# Patient Record
Sex: Female | Born: 1955 | Race: White | Hispanic: No | Marital: Married | Smoking: Never smoker
Health system: Southern US, Community
[De-identification: ages and names within clinical notes are randomized; demographics above are authoritative.]

## PROBLEM LIST (undated history)

## (undated) DIAGNOSIS — D649 Anemia, unspecified: Secondary | ICD-10-CM

## (undated) DIAGNOSIS — E785 Hyperlipidemia, unspecified: Secondary | ICD-10-CM

## (undated) DIAGNOSIS — M259 Joint disorder, unspecified: Secondary | ICD-10-CM

## (undated) DIAGNOSIS — E079 Disorder of thyroid, unspecified: Secondary | ICD-10-CM

## (undated) DIAGNOSIS — M1990 Unspecified osteoarthritis, unspecified site: Secondary | ICD-10-CM

## (undated) DIAGNOSIS — Z87898 Personal history of other specified conditions: Secondary | ICD-10-CM

## (undated) DIAGNOSIS — N840 Polyp of corpus uteri: Secondary | ICD-10-CM

## (undated) DIAGNOSIS — Z8619 Personal history of other infectious and parasitic diseases: Secondary | ICD-10-CM

## (undated) DIAGNOSIS — N979 Female infertility, unspecified: Secondary | ICD-10-CM

## (undated) DIAGNOSIS — B379 Candidiasis, unspecified: Secondary | ICD-10-CM

## (undated) DIAGNOSIS — T7840XA Allergy, unspecified, initial encounter: Secondary | ICD-10-CM

## (undated) DIAGNOSIS — B977 Papillomavirus as the cause of diseases classified elsewhere: Secondary | ICD-10-CM

## (undated) DIAGNOSIS — B029 Zoster without complications: Secondary | ICD-10-CM

## (undated) DIAGNOSIS — I10 Essential (primary) hypertension: Secondary | ICD-10-CM

## (undated) DIAGNOSIS — R011 Cardiac murmur, unspecified: Secondary | ICD-10-CM

## (undated) HISTORY — DX: Zoster without complications: B02.9

## (undated) HISTORY — DX: Candidiasis, unspecified: B37.9

## (undated) HISTORY — PX: LAPAROSCOPY: SHX197

## (undated) HISTORY — DX: Female infertility, unspecified: N97.9

## (undated) HISTORY — DX: Polyp of corpus uteri: N84.0

## (undated) HISTORY — DX: Anemia, unspecified: D64.9

## (undated) HISTORY — DX: Personal history of other infectious and parasitic diseases: Z86.19

## (undated) HISTORY — PX: BUNIONECTOMY: SHX129

## (undated) HISTORY — PX: DILATION AND CURETTAGE OF UTERUS: SHX78

## (undated) HISTORY — DX: Essential (primary) hypertension: I10

## (undated) HISTORY — PX: WISDOM TOOTH EXTRACTION: SHX21

## (undated) HISTORY — PX: OTHER SURGICAL HISTORY: SHX169

## (undated) HISTORY — DX: Papillomavirus as the cause of diseases classified elsewhere: B97.7

## (undated) HISTORY — DX: Joint disorder, unspecified: M25.9

## (undated) HISTORY — DX: Disorder of thyroid, unspecified: E07.9

## (undated) HISTORY — DX: Unspecified osteoarthritis, unspecified site: M19.90

## (undated) HISTORY — DX: Hyperlipidemia, unspecified: E78.5

## (undated) HISTORY — DX: Allergy, unspecified, initial encounter: T78.40XA

## (undated) HISTORY — DX: Cardiac murmur, unspecified: R01.1

## (undated) HISTORY — DX: Personal history of other specified conditions: Z87.898

## (undated) HISTORY — PX: KNEE SURGERY: SHX244

---

## 1997-07-18 ENCOUNTER — Encounter (HOSPITAL_COMMUNITY): Admission: RE | Admit: 1997-07-18 | Discharge: 1997-10-16 | Payer: Self-pay | Admitting: Obstetrics and Gynecology

## 1997-11-14 ENCOUNTER — Encounter (HOSPITAL_COMMUNITY): Admission: RE | Admit: 1997-11-14 | Discharge: 1998-02-12 | Payer: Self-pay | Admitting: Obstetrics and Gynecology

## 2000-11-10 ENCOUNTER — Other Ambulatory Visit: Admission: RE | Admit: 2000-11-10 | Discharge: 2000-11-10 | Payer: Self-pay | Admitting: Obstetrics and Gynecology

## 2000-11-19 ENCOUNTER — Encounter: Payer: Self-pay | Admitting: Obstetrics and Gynecology

## 2000-11-19 ENCOUNTER — Ambulatory Visit (HOSPITAL_COMMUNITY): Admission: RE | Admit: 2000-11-19 | Discharge: 2000-11-19 | Payer: Self-pay | Admitting: Obstetrics and Gynecology

## 2000-11-26 ENCOUNTER — Encounter: Admission: RE | Admit: 2000-11-26 | Discharge: 2000-11-26 | Payer: Self-pay | Admitting: Obstetrics and Gynecology

## 2000-11-26 ENCOUNTER — Encounter: Payer: Self-pay | Admitting: Obstetrics and Gynecology

## 2001-11-30 ENCOUNTER — Encounter: Payer: Self-pay | Admitting: Obstetrics and Gynecology

## 2001-11-30 ENCOUNTER — Encounter: Admission: RE | Admit: 2001-11-30 | Discharge: 2001-11-30 | Payer: Self-pay | Admitting: Obstetrics and Gynecology

## 2001-12-01 ENCOUNTER — Other Ambulatory Visit: Admission: RE | Admit: 2001-12-01 | Discharge: 2001-12-01 | Payer: Self-pay | Admitting: Obstetrics and Gynecology

## 2002-12-07 ENCOUNTER — Other Ambulatory Visit: Admission: RE | Admit: 2002-12-07 | Discharge: 2002-12-07 | Payer: Self-pay | Admitting: *Deleted

## 2002-12-26 ENCOUNTER — Encounter: Admission: RE | Admit: 2002-12-26 | Discharge: 2002-12-26 | Payer: Self-pay | Admitting: Obstetrics and Gynecology

## 2002-12-26 ENCOUNTER — Encounter: Payer: Self-pay | Admitting: Obstetrics and Gynecology

## 2004-01-23 ENCOUNTER — Encounter: Admission: RE | Admit: 2004-01-23 | Discharge: 2004-01-23 | Payer: Self-pay | Admitting: Obstetrics and Gynecology

## 2004-02-29 ENCOUNTER — Other Ambulatory Visit: Admission: RE | Admit: 2004-02-29 | Discharge: 2004-02-29 | Payer: Self-pay | Admitting: Obstetrics and Gynecology

## 2005-01-30 ENCOUNTER — Ambulatory Visit (HOSPITAL_COMMUNITY): Admission: RE | Admit: 2005-01-30 | Discharge: 2005-01-30 | Payer: Self-pay | Admitting: Obstetrics and Gynecology

## 2005-02-12 ENCOUNTER — Encounter: Admission: RE | Admit: 2005-02-12 | Discharge: 2005-02-12 | Payer: Self-pay | Admitting: Obstetrics and Gynecology

## 2005-03-31 ENCOUNTER — Encounter (INDEPENDENT_AMBULATORY_CARE_PROVIDER_SITE_OTHER): Payer: Self-pay | Admitting: *Deleted

## 2005-05-05 ENCOUNTER — Other Ambulatory Visit: Admission: RE | Admit: 2005-05-05 | Discharge: 2005-05-05 | Payer: Self-pay | Admitting: Obstetrics and Gynecology

## 2005-08-29 ENCOUNTER — Ambulatory Visit: Payer: Self-pay | Admitting: Sports Medicine

## 2005-09-22 ENCOUNTER — Ambulatory Visit: Payer: Self-pay | Admitting: Family Medicine

## 2005-09-24 ENCOUNTER — Ambulatory Visit: Payer: Self-pay | Admitting: Family Medicine

## 2005-10-21 ENCOUNTER — Ambulatory Visit: Payer: Self-pay | Admitting: Family Medicine

## 2005-10-21 ENCOUNTER — Encounter: Admission: RE | Admit: 2005-10-21 | Discharge: 2005-10-21 | Payer: Self-pay | Admitting: Vascular Surgery

## 2006-02-16 ENCOUNTER — Ambulatory Visit: Payer: Self-pay | Admitting: Family Medicine

## 2006-02-23 ENCOUNTER — Ambulatory Visit (HOSPITAL_COMMUNITY): Admission: RE | Admit: 2006-02-23 | Discharge: 2006-02-23 | Payer: Self-pay | Admitting: Sports Medicine

## 2006-04-03 ENCOUNTER — Ambulatory Visit: Payer: Self-pay | Admitting: Family Medicine

## 2006-04-03 ENCOUNTER — Encounter: Payer: Self-pay | Admitting: Family Medicine

## 2006-04-03 LAB — CONVERTED CEMR LAB
Cholesterol: 223 mg/dL — ABNORMAL HIGH (ref 0–200)
HDL: 63 mg/dL (ref >39)
Total CHOL/HDL Ratio: 3.5
VLDL: 24 mg/dL (ref 0–40)

## 2006-05-29 ENCOUNTER — Encounter (INDEPENDENT_AMBULATORY_CARE_PROVIDER_SITE_OTHER): Payer: Self-pay | Admitting: *Deleted

## 2008-03-31 HISTORY — PX: KNEE SURGERY: SHX244

## 2008-10-09 ENCOUNTER — Ambulatory Visit (HOSPITAL_BASED_OUTPATIENT_CLINIC_OR_DEPARTMENT_OTHER): Admission: RE | Admit: 2008-10-09 | Discharge: 2008-10-10 | Payer: Self-pay | Admitting: Orthopedic Surgery

## 2010-08-13 NOTE — Op Note (Signed)
Renee Jordan, Renee Jordan             ACCOUNT NO.:  [id number]   MEDICAL RECORD NO.:  [id number]          PATIENT TYPE:  AMB   LOCATION:  DSC                          FACILITY:  [facility]   PHYSICIAN:  Robert A. Thurston Hole, M.D. DATE OF BIRTH:  February 10, 1956   DATE OF PROCEDURE:  10/09/2008  DATE OF DISCHARGE:                               OPERATIVE REPORT   PREOPERATIVE DIAGNOSES:  1. Left knee anterior cruciate ligament tear.  2. Left knee medial femoral condyle grade 4 chondral defect.  3. Left knee lateral meniscus tear.   POSTOPERATIVE DIAGNOSES:  1. Left knee anterior cruciate ligament tear.  2. Left knee medial femoral condyle grade 4 chondral defect.  3. Left knee lateral meniscus tear.   PROCEDURE:  1. Left knee EUA followed by arthroscopically-assisted endoscopic      Achilles tendon allograft ACL reconstruction using PushLock anchor      for femoral fixation and 9- x 28-mm tibial interference BioScrew      plus PushLock anchor.  2. Left knee medial femoral condyle microfracture drilling.  3. Left knee partial lateral meniscectomy.   SURGEON:  Elana Alm. Thurston Hole, MD   ASSISTANT:  Julien Girt, PA   ANESTHESIA:  General.   OPERATIVE TIME:  One hour and 25 minutes.   COMPLICATIONS:  None.   INDICATIONS FOR PROCEDURE:  Ms. Wind is a 55 year old woman who is  an avid athlete who sustained a twisting pivot shift injury to her left  knee approximately 2-1/2 to 3 weeks ago.  Exam and MRI has revealed a  complete ACL tear with a medial femoral condyle defect and lateral  meniscus tear.  She is now to undergo arthroscopy with ACL  reconstruction.   DESCRIPTION:  Ms. Kohut was brought to the operating room on October 09, 2008, after a femoral nerve block was placed in the holding by  Anesthesia.  She was placed on the operative table in supine position.  She received Ancef 1 g IV preoperatively for prophylaxis.  After being  placed under general anesthesia, her left knee  was examined.  She had  full range of motion, 3+ Lachman, positive pivot shift, knee stable to  varus valgus and posterior stress with normal patella tracking.  The  knee was then sterilely injected with 0.25% Marcaine with epinephrine.  Left leg was then prepped using sterile DuraPrep and draped using a  sterile technique.  Originally through a anterolateral portal, the  arthroscope with a pump attached was placed into an anteromedial portal  and arthroscopic probe was placed.  On initial inspection of the medial  compartment, she was found to have a grade 4 chondral defect which was  acute on the medial femoral condyle measuring 2 x 2 cm.  This was felt  to be amenable to microfracture drilling.  The edges were debrided, and  chondroplasty was performed around this area and then multiple small  microfracture holes were made with a curved awl.  The medial meniscus  which had undergone a previous partial meniscectomy was intact.  Medial  tibial plateau showed 20% grade 3 and the rest grade 1  and 2  chondromalacia.  The intercondylar notch inspected the anterior cruciate  ligament and was found to be completely torn at its mid substance with  significant anterior laxity and this was thoroughly debrided and a  notchplasty was performed.  Posterior cruciate was intact and stable.  Lateral compartment was inspected.  The articular cartilage showed 25%  grade 3 chondromalacia which was debrided.  Lateral meniscus showed  tearing of the posterior medial root 25% which was resected back to a  stable rim.  The rest of the lateral meniscus was intact.  Patellofemoral joint showed grade 1 to 2 chondromalacia and the patella  tracked normally.  Moderate synovitis in the medial and lateral gutters  were debrided, otherwise this was free of pathology.  At this point,  Julien Girt, whose medical and surgical assistance was absolutely  medically and surgically necessary, prepared the ACL allograft  and  Achilles tendon allograft on the back table while I prepared the inside  of the knee to accept this graft.  She prepared a 9- x 25-mm piece of  Achilles bone with a 2- to 12-mm graft.  At this point, through an  anteromedial 1.5-cm proximal tibial incision, a Steinmann pin was  drilled up in the ACL insertion point with a tibial drill guide and then  overdrilled with a 9-mm drill.  The tibial tunnel was then expanded with  tunnel expanders to a 10-mm size.  Through this tibial tunnel, the  posterior femoral guide was placed in the posterior femoral notch and  then a Steinmann pin drilled up in the ACL origin point and then  overdrilled with a 10-mm drill to a depth of 30 mm.  The posterior  femoral wall was somewhat thin, and with her moderate osteopenia I felt  that to an extra-articular PushLock anchor would be a better fixation  for the femoral end of the plug and then an interference BioScrew.  A  double pin passer was then brought up to the tibial tunnel and joining  up through the femoral tunnel and through the femoral cortex and thigh  through a stab wound.  A 3-cm incision was then made at the exit point  of this double pin passer over the lateral femoral condyle.  Careful  dissection was carried out, and the vastus lateralis and the iliotibial  band was split longitudinally so that the exit point could be well  visualized as well as the distal lateral femoral cortex.  At this point,  the double pin passer was used to pass the ACL allograft up through the  tibial tunnel and up into the femoral tunnel.  With an excellent  position in the femoral tunnel, a 3.5-mm PushLock anchor was deployed  over the lateral femoral cortex with the 2 sets of #2 FiberWire that  were in the femoral bone plug and this gave excellent extra-articular  fixation to the femoral end of the plug.  The knee was then brought  through a full range of motion.  There was found to be no impingement of  the  graft, and the femoral end of the bone plug was found to be in  excellent position.  At this point, the tibial end of the allograft was  locked into position with a 9- x 28-mm interference BioScrew while  Kirstin Shepperson held the tibia reduced on the femur and 30 degrees of  flexion.  The tibial end of the graft was then further backed up and  fixed with another  3.5-mm PushLock anchor in the anteromedial tibial  cortex.  At this point, the knee was tested for stability.  Lachman and  pivot shift were found to be totally eliminated, and the knee could be  brought through a full range of motion with no impingement of the graft.  At this point, it was felt that all pathology have been satisfactorily  addressed.  The instruments were removed.  The anteromedial and distal  lateral incision were closed with 0 Vicryl to close the iliotibial band  laterally and subcutaneous tissues closed with 0 and 2-0 Vicryl,  subcuticular layers closed with 4-0 Prolene, the pleural arthroscopic  portals closed with 3-0 nylon, sterile dressings and a long-leg splint  applied, then the patient awakened, taken to recovery room in stable  condition.  Needle and sponge counts correct x2 at the end of the case.   FOLLOWUP CARE:  Ms. Taaffe will be followed overnight at the recovery  care center for IV pain control, neurovascular monitoring, and CPM use.  Discharge tomorrow on Nucynta and Robaxin with a home CPM.  She may call  us in a week for sutures out and followup.      Robert A. Thurston Hole, M.D.  Electronically Signed     RAW/MEDQ  D:  10/09/2008  T:  10/09/2008  Job:  [id number]

## 2010-11-27 ENCOUNTER — Ambulatory Visit
Admission: RE | Admit: 2010-11-27 | Discharge: 2010-11-27 | Disposition: A | Discharge status: Home / Self Care | Payer: BC Managed Care – PPO | Source: Ambulatory Visit | Attending: Internal Medicine | Admitting: Internal Medicine

## 2010-11-27 ENCOUNTER — Other Ambulatory Visit: Payer: Self-pay | Admitting: Internal Medicine

## 2010-11-27 DIAGNOSIS — E041 Nontoxic single thyroid nodule: Secondary | ICD-10-CM

## 2010-12-05 ENCOUNTER — Other Ambulatory Visit: Payer: Self-pay | Admitting: Otolaryngology

## 2010-12-05 DIAGNOSIS — E041 Nontoxic single thyroid nodule: Secondary | ICD-10-CM

## 2010-12-11 ENCOUNTER — Other Ambulatory Visit (HOSPITAL_COMMUNITY)
Admission: RE | Admit: 2010-12-11 | Discharge: 2010-12-11 | Disposition: A | Discharge status: Home / Self Care | Payer: BC Managed Care – PPO | Source: Ambulatory Visit | Attending: Interventional Radiology | Admitting: Interventional Radiology

## 2010-12-11 ENCOUNTER — Ambulatory Visit
Admission: RE | Admit: 2010-12-11 | Discharge: 2010-12-11 | Disposition: A | Discharge status: Home / Self Care | Payer: BC Managed Care – PPO | Source: Ambulatory Visit | Attending: Otolaryngology | Admitting: Otolaryngology

## 2010-12-11 DIAGNOSIS — E049 Nontoxic goiter, unspecified: Secondary | ICD-10-CM | POA: Insufficient documentation

## 2010-12-11 DIAGNOSIS — E041 Nontoxic single thyroid nodule: Secondary | ICD-10-CM

## 2011-04-04 ENCOUNTER — Ambulatory Visit (INDEPENDENT_AMBULATORY_CARE_PROVIDER_SITE_OTHER): Payer: BC Managed Care – PPO

## 2011-04-04 DIAGNOSIS — E049 Nontoxic goiter, unspecified: Secondary | ICD-10-CM

## 2011-04-04 DIAGNOSIS — L2089 Other atopic dermatitis: Secondary | ICD-10-CM

## 2011-04-08 ENCOUNTER — Other Ambulatory Visit: Payer: Self-pay | Admitting: Family Medicine

## 2011-04-08 DIAGNOSIS — E041 Nontoxic single thyroid nodule: Secondary | ICD-10-CM

## 2011-04-10 ENCOUNTER — Ambulatory Visit
Admission: RE | Admit: 2011-04-10 | Discharge: 2011-04-10 | Disposition: A | Discharge status: Home / Self Care | Payer: BC Managed Care – PPO | Source: Ambulatory Visit | Attending: Family Medicine | Admitting: Family Medicine

## 2011-04-10 DIAGNOSIS — E041 Nontoxic single thyroid nodule: Secondary | ICD-10-CM

## 2011-06-09 ENCOUNTER — Other Ambulatory Visit: Payer: Self-pay | Admitting: Endocrinology

## 2011-06-09 DIAGNOSIS — E049 Nontoxic goiter, unspecified: Secondary | ICD-10-CM

## 2011-09-08 ENCOUNTER — Ambulatory Visit
Admission: RE | Admit: 2011-09-08 | Discharge: 2011-09-08 | Disposition: A | Discharge status: Home / Self Care | Payer: BC Managed Care – PPO | Source: Ambulatory Visit | Attending: Endocrinology | Admitting: Endocrinology

## 2011-09-08 DIAGNOSIS — E049 Nontoxic goiter, unspecified: Secondary | ICD-10-CM

## 2011-10-17 ENCOUNTER — Other Ambulatory Visit: Payer: Self-pay | Admitting: Endocrinology

## 2011-10-17 DIAGNOSIS — E041 Nontoxic single thyroid nodule: Secondary | ICD-10-CM

## 2011-12-01 ENCOUNTER — Ambulatory Visit (INDEPENDENT_AMBULATORY_CARE_PROVIDER_SITE_OTHER): Payer: BC Managed Care – PPO | Admitting: Family Medicine

## 2011-12-01 VITALS — BP 128/60 | HR 62 | Temp 97.9°F | Resp 14 | Ht 65.75 in | Wt 158.0 lb

## 2011-12-01 DIAGNOSIS — B029 Zoster without complications: Secondary | ICD-10-CM

## 2011-12-01 MED ORDER — VALACYCLOVIR HCL 1 G PO TABS: 1000.0000 mg | ORAL_TABLET | Freq: Three times a day (TID) | ORAL | Status: DC

## 2011-12-01 MED ORDER — VALACYCLOVIR HCL 1 G PO TABS: 1000.0000 mg | ORAL_TABLET | Freq: Three times a day (TID) | ORAL | Status: AC

## 2011-12-01 NOTE — Progress Notes (Signed)
56 yo with left flank painful skin rash x 24 hours.  Objective: NAD Papules along T8 dermatome left side  Assessment:  Shingles 1. Shingles  valACYclovir (VALTREX) 1000 MG tablet, DISCONTINUED: valACYclovir (VALTREX) 1000 MG tablet

## 2011-12-02 ENCOUNTER — Other Ambulatory Visit: Payer: Self-pay | Admitting: Family Medicine

## 2011-12-02 ENCOUNTER — Telehealth: Payer: Self-pay

## 2011-12-02 DIAGNOSIS — B029 Zoster without complications: Secondary | ICD-10-CM

## 2011-12-02 MED ORDER — ONDANSETRON 8 MG PO TBDP: 8.0000 mg | ORAL_TABLET | Freq: Three times a day (TID) | ORAL | Status: AC | PRN

## 2011-12-02 MED ORDER — TRAMADOL HCL 50 MG PO TABS: 50.0000 mg | ORAL_TABLET | Freq: Three times a day (TID) | ORAL | Status: AC | PRN

## 2011-12-02 NOTE — Telephone Encounter (Signed)
meds sent

## 2011-12-02 NOTE — Telephone Encounter (Signed)
Can we send in meds for patient?

## 2011-12-02 NOTE — Telephone Encounter (Signed)
Pt was in office for shingles and was asked to call back if having pain she would like to see if rx for tramadol be called in and also nausea medication [phone] [facility]

## 2011-12-03 ENCOUNTER — Telehealth: Payer: Self-pay

## 2011-12-03 NOTE — Telephone Encounter (Signed)
Pt had called to say pharmacy had not received any Rxs for her and her husband is at the pharmacy now. Called [facility] and gave [facility] all three of the Rxs printed by Dr Milus Glazier on 9/2 and 12/02/11.

## 2011-12-12 ENCOUNTER — Telehealth: Payer: Self-pay

## 2011-12-12 NOTE — Telephone Encounter (Signed)
PT STATES SHE WAS SEEN FOR SHINGLES AND IS STILL HAVING A PROBLEM WITH THE PAIN. WOULD LIKE TO SPEAK WITH SOMEONE PLEASE CALL [phone]

## 2011-12-12 NOTE — Telephone Encounter (Signed)
Skin sensitivity now s/p shingles, deep pain gone but area very painful/ sensitive getting worse not better. [facility]ses [facility] [address] Would like Rx for Lidoderm patch. She is worried however about putting Lidoderm patch directly on rash. Please advise.

## 2011-12-13 MED ORDER — GABAPENTIN 300 MG PO CAPS: ORAL_CAPSULE | ORAL | Status: DC

## 2011-12-13 NOTE — Telephone Encounter (Signed)
Spoke with pt, she takes meloxicam everyday for another condition so she cannot take NSAIDS with it. Marylene Land said it is ok to try gabapentin. Pt agreed to try it. Please send in

## 2011-12-13 NOTE — Telephone Encounter (Signed)
Is she taking any ibuprofen or tylenol for the pain?  I could call her an anti-inflammatory for pain and something for the increased nerve sensations. I'm not sure that the lidoderm patch is a good idea if she still has sores from shingles

## 2012-01-16 ENCOUNTER — Encounter: Payer: Self-pay | Admitting: Obstetrics and Gynecology

## 2012-01-16 ENCOUNTER — Ambulatory Visit (INDEPENDENT_AMBULATORY_CARE_PROVIDER_SITE_OTHER): Payer: BC Managed Care – PPO | Admitting: Obstetrics and Gynecology

## 2012-01-16 VITALS — BP 122/68 | Ht 66.5 in | Wt 164.0 lb

## 2012-01-16 DIAGNOSIS — Z124 Encounter for screening for malignant neoplasm of cervix: Secondary | ICD-10-CM

## 2012-01-16 DIAGNOSIS — B029 Zoster without complications: Secondary | ICD-10-CM | POA: Insufficient documentation

## 2012-01-16 DIAGNOSIS — Z780 Asymptomatic menopausal state: Secondary | ICD-10-CM | POA: Insufficient documentation

## 2012-01-16 DIAGNOSIS — E049 Nontoxic goiter, unspecified: Secondary | ICD-10-CM

## 2012-01-16 NOTE — Progress Notes (Signed)
The patient reports:no complaints  Contraception:Post-Menopause  Last mammogram: approximate date 2006 and was normal  Last pap: approximate date 2009 and was normal   GC/Chlamydia cultures offered: declined HIV/RPR/HbsAg offered:  declined HSV 1 and 2 glycoprotein offered: declined  Menstrual cycle regular and monthly: No: Post-Menopause Menstrual flow normal: No:   Urinary symptoms: none Normal bowel movements: Yes Reports abuse at home: No:

## 2012-01-16 NOTE — Progress Notes (Signed)
Subjective:    Renee Jordan is a 56 y.o. female [phone] who presents for annual exam.  She is post-menopausal x several years .  Menopausal sx have resolved. Patient reports no gyn issues.  Recent health issues include goiter, followed by Dr. Talmage Nap, and recent shingles, followed the [facility].   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  New grandson, lives in [place].  The following portions of the patient's history were reviewed and updated as appropriate: allergies, current medications, past family history, past medical history, past social history, past surgical history and problem list.  Review of Systems Pertinent items are noted in HPI. Gastrointestinal:No change in bowel habits, no abdominal pain, no rectal bleeding Genitourinary:negative for dysuria, frequency, hematuria, nocturia and urinary incontinence    Objective:     BP 122/68  Ht 5' 6.5" (1.689 m)  Wt 164 lb (74.39 kg)  BMI 26.07 kg/m2  Weight:  Wt Readings from Last 1 Encounters:  01/16/12 164 lb (74.39 kg)     BMI: Body mass index is 26.07 kg/(m^2). General Appearance: Alert, appropriate appearance for age. No acute distress HEENT: Grossly normal Neck / Thyroid: Supple, no masses, nodes or enlargement Lungs: clear to auscultation bilaterally Back: No CVA tenderness Breast Exam: No masses or nodes.No dimpling, nipple retraction or discharge. Cardiovascular: Regular rate and rhythm. S1, S2, no murmur Gastrointestinal: Soft, non-tender, no masses or organomegaly Pelvic Exam: Vulva and vagina appear normal. Bimanual exam reveals normal uterus and adnexa. Rectovaginal:  No masses or pain on rectal exam.  Lymphatic Exam: Non-palpable nodes in neck, clavicular, axillary, or inguinal regions  Skin: no rash or abnormalities Neurologic: Normal gait and speech, no tremor  Psychiatric: Alert and oriented, appropriate affect.    Urinalysis:Not done        Assessment:    Normal gyn exam --menopausal female Pap:  done   Plan:    All questions answered.  Mammogram:  Declines at present--this has been her perspective for several years. Dexa Scan:   NA Follow-up:  for annual exam in 1 year Rxs:   None   Wallace Gappa CNM, [place] 01/16/2012 1:47 PM

## 2012-01-19 LAB — PAP IG W/ RFLX HPV ASCU

## 2012-04-02 IMAGING — US US THYROID BIOPSY
1 series · 13 of 16 positions shown · non-contrast
Comparison: none

Clinical Data/Indication: Bilateral dominant thyroid nodules

ULTRASOUND-GUIDED BIOPSY OF BILATERAL DOMINANT THYROID NODULES.
FINE NEEDLE ASPIRATION.
Procedure: The procedure, risks, benefits, and alternatives were
explained to the patient. Questions regarding the procedure were
encouraged and answered. The patient understands and consents to
the procedure.
The neck was prepped with betadine in a sterile fashion, and a
sterile drape was applied covering the operative field. A sterile
gown and sterile gloves were used for the procedure.
Under sonographic guidance, three 25 gauge fine needle aspirates of
the dominant right thyroid nodule and three 25 gauge fine needle
aspirates of the dominant left thyroid nodule were obtained. Final
imaging was performed.
Patient tolerated the procedure well without complication.  Vital
sign monitoring by nursing staff during the procedure will continue
as patient is in the special procedures unit for post procedure
observation.

[Series 1: us thyroid biopsy · 0.08mm/px · 16 acquisitions, 13 frames shown]
[im 1/16]
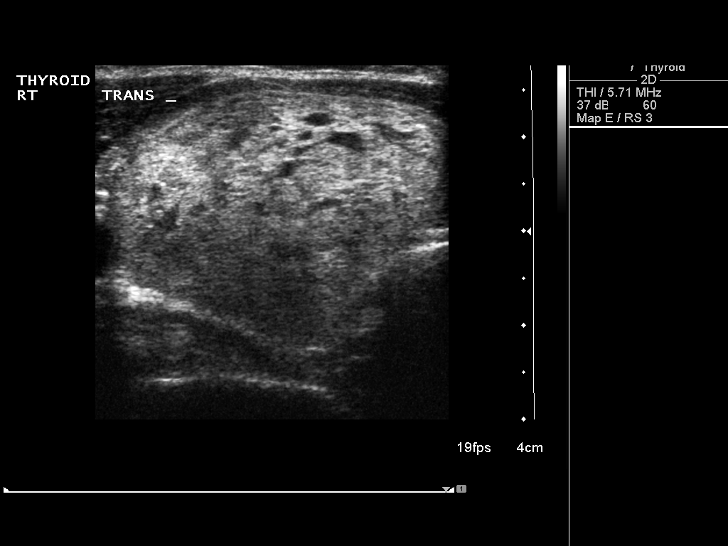
[im 2/16]
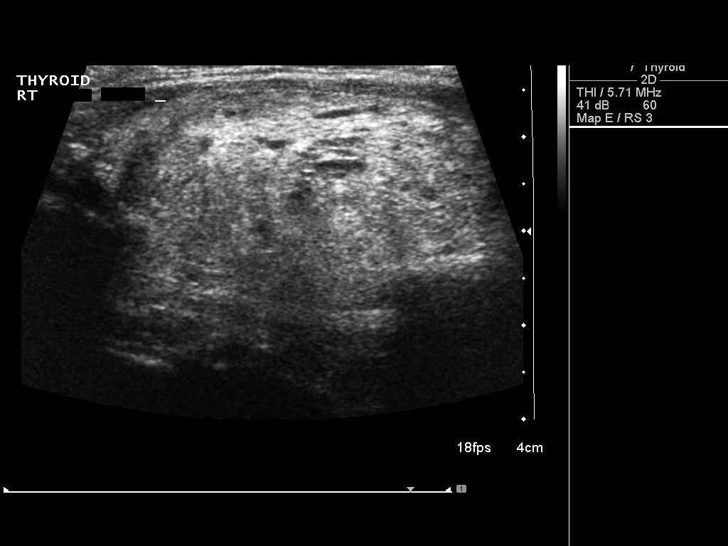
[im 4/16]
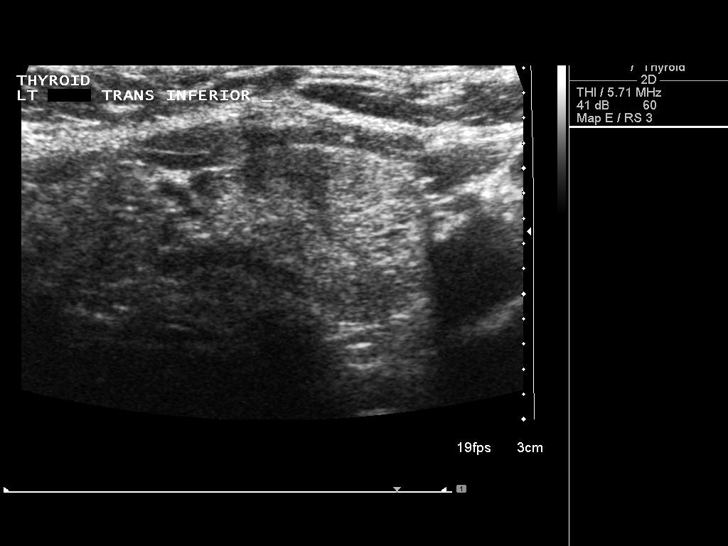
[im 5/16]
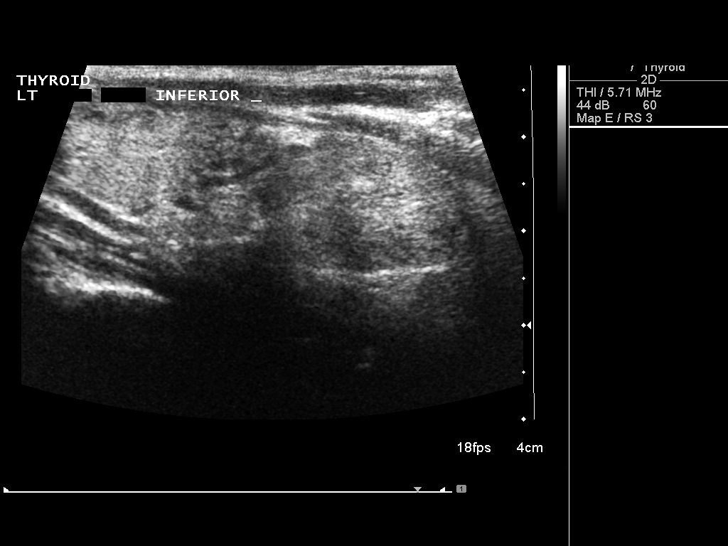
[im 6/16]
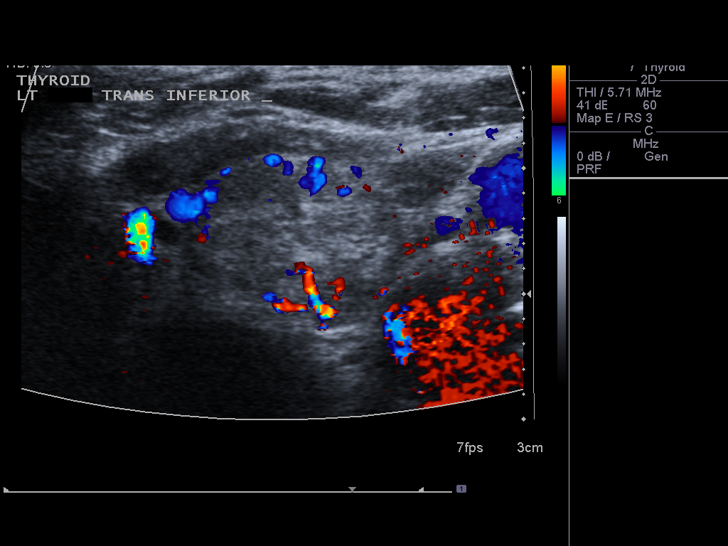
[im 7/16]
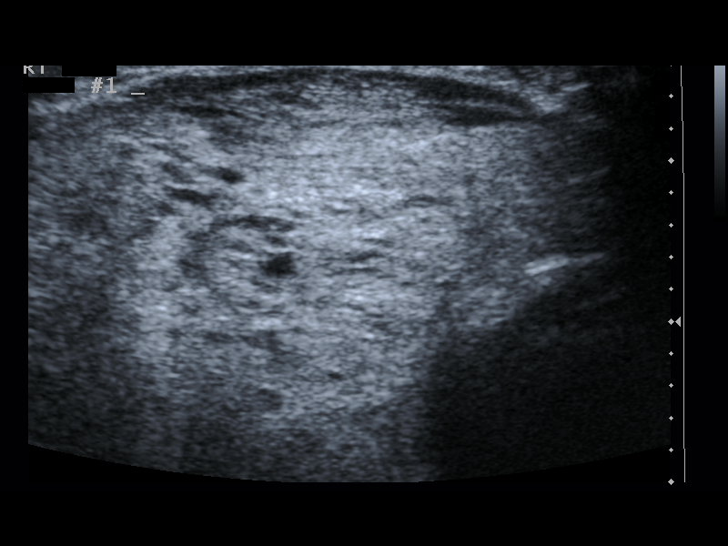
[im 9/16]
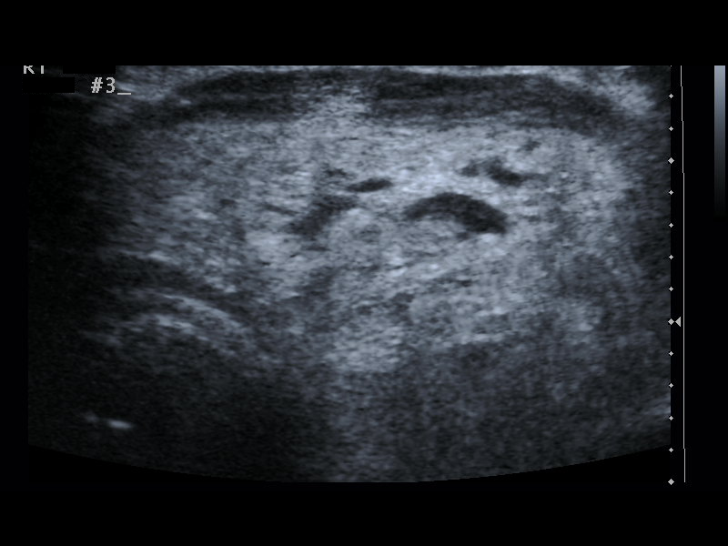
[im 10/16]
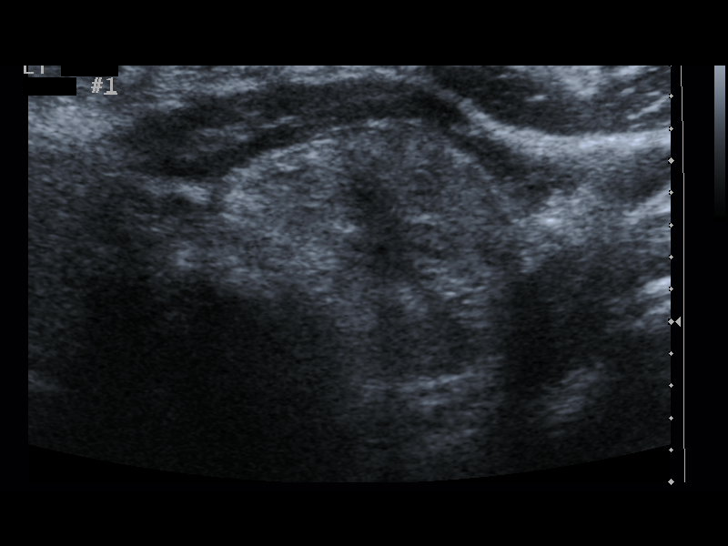
[im 11/16]
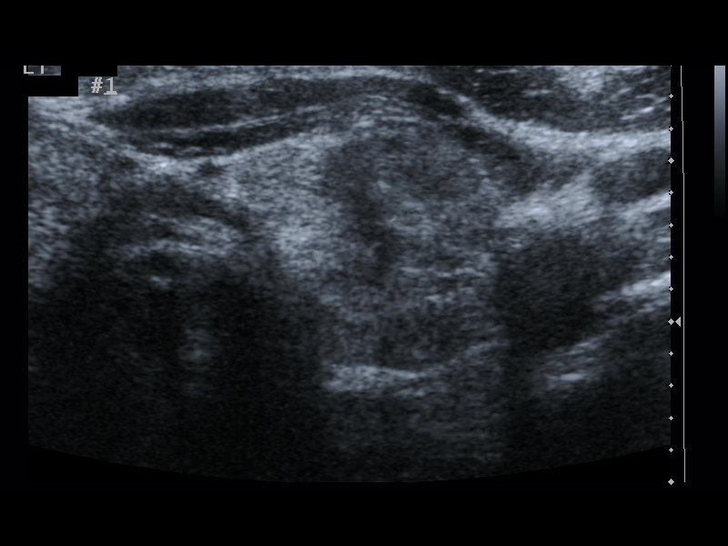
[im 12/16]
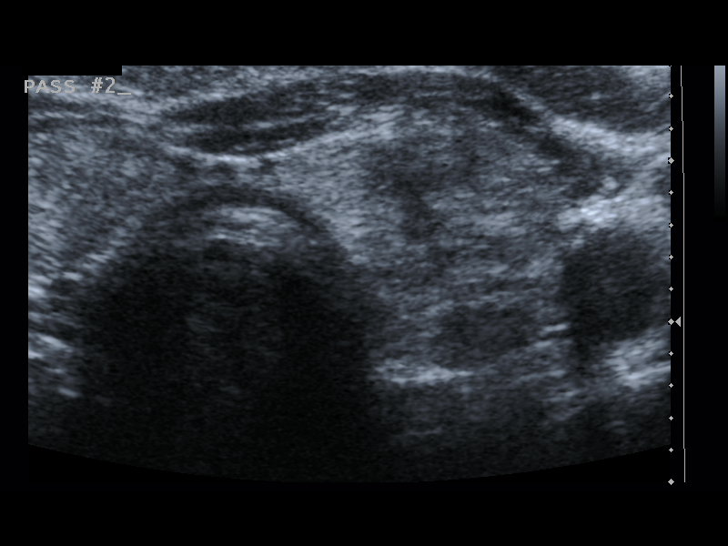
[im 13/16]
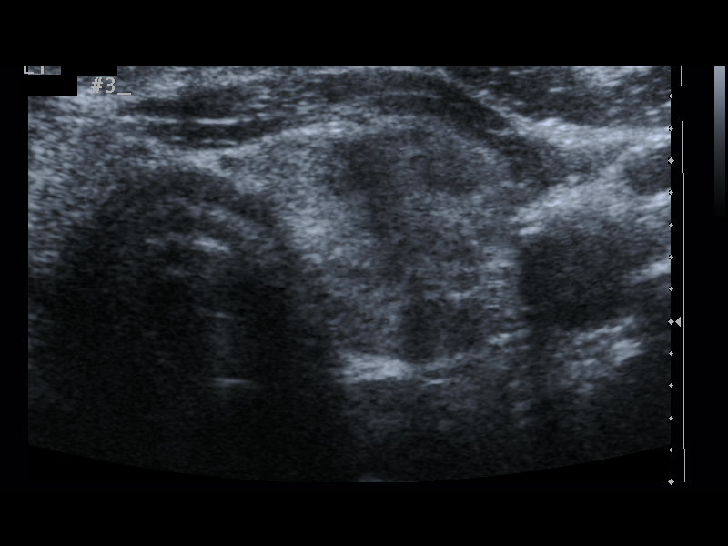
[im 15/16]
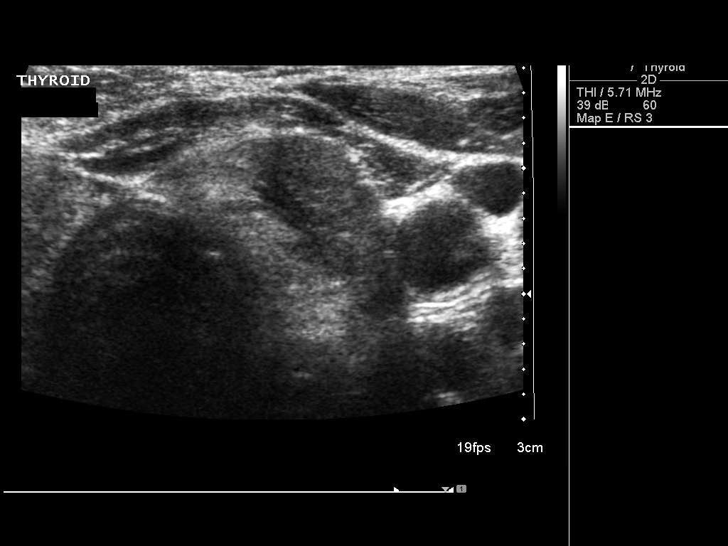
[im 16/16]
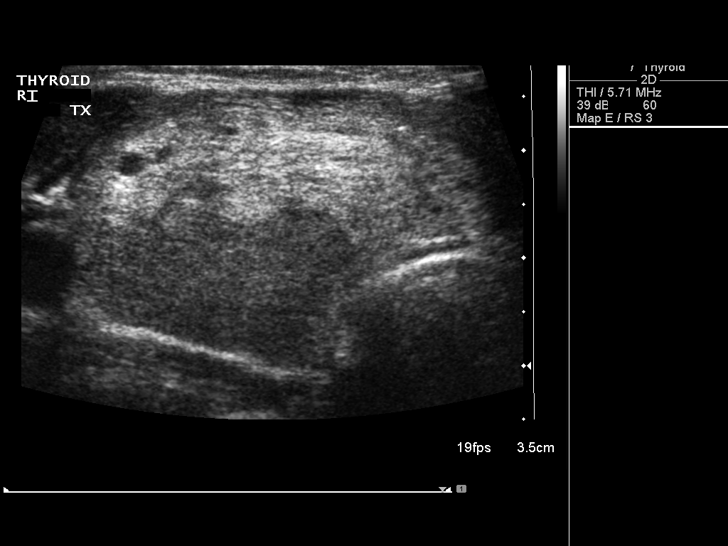

[13 of 16 positions shown; findings below may reference images not displayed]

FINDINGS: The images document guide needle placement within the
bilateral dominant thyroid nodules. Post biopsy images demonstrate
no hemorrhage.
IMPRESSION: Successful ultrasound-guided fine needle aspiration biopsy of
bilateral dominant thyroid nodules.

## 2012-04-05 ENCOUNTER — Ambulatory Visit
Admission: RE | Admit: 2012-04-05 | Discharge: 2012-04-05 | Disposition: A | Discharge status: Home / Self Care | Payer: BC Managed Care – PPO | Source: Ambulatory Visit | Attending: Endocrinology | Admitting: Endocrinology

## 2012-04-05 DIAGNOSIS — E041 Nontoxic single thyroid nodule: Secondary | ICD-10-CM

## 2012-09-07 ENCOUNTER — Other Ambulatory Visit: Payer: Self-pay | Admitting: Endocrinology

## 2012-09-07 DIAGNOSIS — E041 Nontoxic single thyroid nodule: Secondary | ICD-10-CM

## 2012-12-13 ENCOUNTER — Ambulatory Visit
Admission: RE | Admit: 2012-12-13 | Discharge: 2012-12-13 | Disposition: A | Discharge status: Home / Self Care | Payer: BC Managed Care – PPO | Source: Ambulatory Visit | Attending: Endocrinology | Admitting: Endocrinology

## 2012-12-13 DIAGNOSIS — E041 Nontoxic single thyroid nodule: Secondary | ICD-10-CM

## 2012-12-21 ENCOUNTER — Other Ambulatory Visit: Payer: Self-pay | Admitting: Endocrinology

## 2012-12-21 DIAGNOSIS — E049 Nontoxic goiter, unspecified: Secondary | ICD-10-CM

## 2013-11-21 ENCOUNTER — Other Ambulatory Visit: Payer: BC Managed Care – PPO

## 2013-12-14 ENCOUNTER — Ambulatory Visit
Admission: RE | Admit: 2013-12-14 | Discharge: 2013-12-14 | Disposition: A | Discharge status: Home / Self Care | Payer: BC Managed Care – PPO | Source: Ambulatory Visit | Attending: Endocrinology | Admitting: Endocrinology

## 2013-12-14 ENCOUNTER — Encounter (INDEPENDENT_AMBULATORY_CARE_PROVIDER_SITE_OTHER): Payer: Self-pay

## 2013-12-14 ENCOUNTER — Other Ambulatory Visit: Payer: BC Managed Care – PPO

## 2013-12-14 DIAGNOSIS — E049 Nontoxic goiter, unspecified: Secondary | ICD-10-CM

## 2014-01-03 LAB — BASIC METABOLIC PANEL
BUN: 26 mg/dL — AB (ref 4–21)
Creatinine: 1.1 mg/dL (ref <=1.1)
POTASSIUM: 4.2 mmol/L (ref 3.4–5.3)
SODIUM: 138 mmol/L (ref 137–147)

## 2014-01-03 LAB — CBC AND DIFFERENTIAL
HCT: 41 % (ref 36–46)
Hemoglobin: 14 g/dL (ref 12.0–16.0)
PLATELETS: 282 10*3/uL (ref 150–399)
WBC: 5 10*3/mL

## 2014-01-03 LAB — HEPATIC FUNCTION PANEL
ALT: 29 U/L (ref 7–35)
AST: 29 U/L (ref 13–35)
Alkaline Phosphatase: 60 U/L (ref 25–125)
Bilirubin, Total: 0.6 mg/dL

## 2014-01-26 ENCOUNTER — Telehealth: Payer: Self-pay | Admitting: Urgent Care

## 2014-01-26 ENCOUNTER — Encounter: Payer: Self-pay | Admitting: Family Medicine

## 2014-01-26 ENCOUNTER — Ambulatory Visit (INDEPENDENT_AMBULATORY_CARE_PROVIDER_SITE_OTHER): Payer: BC Managed Care – PPO | Admitting: Family Medicine

## 2014-01-26 ENCOUNTER — Ambulatory Visit (INDEPENDENT_AMBULATORY_CARE_PROVIDER_SITE_OTHER): Payer: BC Managed Care – PPO

## 2014-01-26 VITALS — BP 120/74 | HR 57 | Temp 97.5°F | Resp 16 | Ht 66.75 in | Wt 175.0 lb

## 2014-01-26 DIAGNOSIS — M170 Bilateral primary osteoarthritis of knee: Secondary | ICD-10-CM

## 2014-01-26 DIAGNOSIS — M5030 Other cervical disc degeneration, unspecified cervical region: Secondary | ICD-10-CM

## 2014-01-26 DIAGNOSIS — Z13220 Encounter for screening for lipoid disorders: Secondary | ICD-10-CM

## 2014-01-26 DIAGNOSIS — R944 Abnormal results of kidney function studies: Secondary | ICD-10-CM

## 2014-01-26 DIAGNOSIS — M79602 Pain in left arm: Secondary | ICD-10-CM

## 2014-01-26 DIAGNOSIS — M79601 Pain in right arm: Secondary | ICD-10-CM

## 2014-01-26 DIAGNOSIS — E039 Hypothyroidism, unspecified: Secondary | ICD-10-CM

## 2014-01-26 DIAGNOSIS — M1990 Unspecified osteoarthritis, unspecified site: Secondary | ICD-10-CM | POA: Insufficient documentation

## 2014-01-26 DIAGNOSIS — M542 Cervicalgia: Secondary | ICD-10-CM

## 2014-01-26 DIAGNOSIS — Z1159 Encounter for screening for other viral diseases: Secondary | ICD-10-CM

## 2014-01-26 DIAGNOSIS — R10813 Right lower quadrant abdominal tenderness: Secondary | ICD-10-CM

## 2014-01-26 DIAGNOSIS — Z1211 Encounter for screening for malignant neoplasm of colon: Secondary | ICD-10-CM

## 2014-01-26 NOTE — Telephone Encounter (Signed)
I called the patient today to discuss c-spine results. Also sent patient a message via my chart to confirm our plan to schedule an MRI as discussed. Will follow up s/p MRI results.  Wallis BambergMario Judyth Demarais, PA-C [facility] [phone] 01/26/2014 2:24 PM
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
## 2014-01-26 NOTE — Progress Notes (Signed)
As per Wallis BambergMario Mani, PA-C's documentation, this 58 y.o. Cauc female presents to establish care. I have reviewed the notes, interviewed and examined the pt and agree with findings . Discussed assessment and plan of care with PA-C.  Pt will follow-up w/ me in 3-4 months or prn.

## 2014-01-27 ENCOUNTER — Telehealth: Payer: Self-pay

## 2014-01-27 NOTE — Telephone Encounter (Signed)
Peer to peer needed for further processing of prior auth...[phone]  Rigoberto NoelHOMPSON , MARNIE Member #: [id number] [address] [place] [id number] Date of Birth: [birth year]

## 2014-01-30 ENCOUNTER — Encounter: Payer: Self-pay | Admitting: Family Medicine

## 2014-01-30 NOTE — Telephone Encounter (Signed)
This number doesn't do the diagnostic review.  [facility] [phone]. The person I spoke with was kind enough to transfer my call.  Nurse Reviewer Elita QuickPam.  Needs 3 weeks of conservative therapy or objective findings of neurologic deficit on exam.  Request withdrawn. Forward to Dr. Audria NineMcPherson and Mr. Urban GibsonMani for consideration of options. If desire to proceed without conservative therapy, will need to re-initiate request and speak to a PHYSICIAN reviewer.

## 2014-01-31 NOTE — Telephone Encounter (Signed)
Dr Audria Ninemcpherson:    Referrals needs to know asap if the scan needs to be proceeded with

## 2014-02-01 NOTE — Telephone Encounter (Signed)
I would prefer pt return in 3-4 weeks for re-evaluation of symptoms. Please contact pt and schedule follow-up. Thank you.

## 2014-02-02 NOTE — Telephone Encounter (Signed)
LMVM for pt to CB to schedule appt for 3-4 weeks with Dr. Audria NineMcPherson.

## 2014-02-07 LAB — POC HEMOCCULT BLD/STL (OFFICE/1-CARD/DIAGNOSTIC): Card #1 Date: NEGATIVE

## 2014-02-09 NOTE — Telephone Encounter (Signed)
Appointment is scheduled for 02/22/14 at 10:15 with Dr. Audria NineMcPherson.

## 2014-02-22 ENCOUNTER — Encounter: Payer: Self-pay | Admitting: Family Medicine

## 2014-02-22 ENCOUNTER — Ambulatory Visit (INDEPENDENT_AMBULATORY_CARE_PROVIDER_SITE_OTHER): Payer: BC Managed Care – PPO | Admitting: Family Medicine

## 2014-02-22 VITALS — BP 130/80 | HR 57 | Temp 97.9°F | Resp 16 | Ht 66.5 in | Wt 177.6 lb

## 2014-02-22 DIAGNOSIS — R3129 Other microscopic hematuria: Secondary | ICD-10-CM

## 2014-02-22 DIAGNOSIS — R312 Other microscopic hematuria: Secondary | ICD-10-CM

## 2014-02-22 DIAGNOSIS — N390 Urinary tract infection, site not specified: Secondary | ICD-10-CM

## 2014-02-22 LAB — POCT UA - MICROSCOPIC ONLY
CASTS, UR, LPF, POC: NEGATIVE
Crystals, Ur, HPF, POC: NEGATIVE
Mucus, UA: NEGATIVE
YEAST UA: NEGATIVE

## 2014-02-22 LAB — POCT URINALYSIS DIPSTICK
BILIRUBIN UA: NEGATIVE
Glucose, UA: NEGATIVE
KETONES UA: NEGATIVE
LEUKOCYTES UA: NEGATIVE
NITRITE UA: NEGATIVE
PH UA: 7
PROTEIN UA: NEGATIVE
Spec Grav, UA: 1.01
Urobilinogen, UA: 0.2
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
## 2014-02-22 NOTE — Patient Instructions (Addendum)
Hematuria Hematuria is blood in your urine. It can be caused by a bladder infection, kidney infection, prostate infection, kidney stone, or cancer of your urinary tract. Infections can usually be treated with medicine, and a kidney stone usually will pass through your urine. If neither of these is the cause of your hematuria, further workup to find out the reason may be needed. It is very important that you tell your health care provider about any blood you see in your urine, even if the blood stops without treatment or happens without causing pain. Blood in your urine that happens and then stops and then happens again can be a symptom of a very serious condition. Also, pain is not a symptom in the initial stages of many urinary cancers. HOME CARE INSTRUCTIONS   Drink lots of fluid, 3-4 quarts a day. If you have been diagnosed with an infection, cranberry juice is especially recommended, in addition to large amounts of water.  Avoid caffeine, tea, and carbonated beverages because they tend to irritate the bladder.  Avoid alcohol because it may irritate the prostate.  Take all medicines as directed by your health care provider.  If you were prescribed an antibiotic medicine, finish it all even if you start to feel better.  If you have been diagnosed with a kidney stone, follow your health care provider's instructions regarding straining your urine to catch the stone.  Empty your bladder often. Avoid holding urine for long periods of time.  After a bowel movement, women should cleanse front to back. Use each tissue only once.  Empty your bladder before and after sexual intercourse if you are a female. SEEK MEDICAL CARE IF:  You develop back pain.  You have a fever.  You have a feeling of sickness in your stomach (nausea) or vomiting.  Your symptoms are not better in 3 days. Return sooner if you are getting worse. SEEK IMMEDIATE MEDICAL CARE IF:   You develop severe vomiting and are  unable to keep the medicine down.  You develop severe back or abdominal pain despite taking your medicines.  You begin passing a large amount of blood or clots in your urine.  You feel extremely weak or faint, or you pass out. MAKE SURE YOU:   Understand these instructions.  Will watch your condition.  Will get help right away if you are not doing well or get worse. Document Released: 03/17/2005 Document Revised: 08/01/2013 Document Reviewed: 11/15/2012 [facility] Patient Information 2015 [employer] [place]. This information is not intended to replace advice given to you by your health care provider. Make sure you discuss any questions you have with your health care provider.   I am ordering an ultrasound to look at your upper urinary tract (kidneys and upper poles or collecting system of the kidneys). As per our discussion, you will probably be referred to UROLOGY once we have all the information back.

## 2014-02-23 LAB — URINE CULTURE
Colony Count: NO GROWTH
ORGANISM ID, BACTERIA: NO GROWTH

## 2014-02-28 ENCOUNTER — Encounter: Payer: Self-pay | Admitting: Family Medicine

## 2014-02-28 ENCOUNTER — Other Ambulatory Visit: Payer: Self-pay

## 2014-03-02 ENCOUNTER — Other Ambulatory Visit: Payer: Self-pay | Admitting: Family Medicine

## 2014-03-02 ENCOUNTER — Ambulatory Visit
Admission: RE | Admit: 2014-03-02 | Discharge: 2014-03-02 | Disposition: A | Discharge status: Home / Self Care | Payer: BC Managed Care – PPO | Source: Ambulatory Visit | Attending: Family Medicine | Admitting: Family Medicine

## 2014-03-02 DIAGNOSIS — R3129 Other microscopic hematuria: Secondary | ICD-10-CM

## 2014-03-02 DIAGNOSIS — N390 Urinary tract infection, site not specified: Secondary | ICD-10-CM

## 2014-03-02 DIAGNOSIS — Z87440 Personal history of urinary (tract) infections: Secondary | ICD-10-CM

## 2014-04-20 ENCOUNTER — Telehealth: Payer: Self-pay | Admitting: Family Medicine

## 2014-04-20 NOTE — Telephone Encounter (Signed)
Patient dropped off blood test results from her urologist. She would like Dr. Audria NineMcPherson to have a copy of this. I have this on Dr. Angelyn PuntMcPherson's desk.  [phone]

## 2014-12-20 ENCOUNTER — Other Ambulatory Visit: Payer: Self-pay | Admitting: Endocrinology

## 2014-12-20 DIAGNOSIS — E049 Nontoxic goiter, unspecified: Secondary | ICD-10-CM

## 2014-12-28 ENCOUNTER — Ambulatory Visit
Admission: RE | Admit: 2014-12-28 | Discharge: 2014-12-28 | Disposition: A | Discharge status: Home / Self Care | Payer: BLUE CROSS/BLUE SHIELD | Source: Ambulatory Visit | Attending: Endocrinology | Admitting: Endocrinology

## 2014-12-28 DIAGNOSIS — E049 Nontoxic goiter, unspecified: Secondary | ICD-10-CM

## 2015-05-28 ENCOUNTER — Ambulatory Visit (INDEPENDENT_AMBULATORY_CARE_PROVIDER_SITE_OTHER): Payer: BLUE CROSS/BLUE SHIELD | Admitting: Family Medicine

## 2015-05-28 ENCOUNTER — Ambulatory Visit (INDEPENDENT_AMBULATORY_CARE_PROVIDER_SITE_OTHER): Payer: BLUE CROSS/BLUE SHIELD

## 2015-05-28 VITALS — BP 112/84 | HR 56 | Temp 97.9°F | Resp 16 | Ht 66.5 in | Wt 172.6 lb

## 2015-05-28 DIAGNOSIS — M542 Cervicalgia: Secondary | ICD-10-CM | POA: Diagnosis not present

## 2015-05-28 DIAGNOSIS — M5030 Other cervical disc degeneration, unspecified cervical region: Secondary | ICD-10-CM | POA: Diagnosis not present

## 2015-05-28 DIAGNOSIS — E049 Nontoxic goiter, unspecified: Secondary | ICD-10-CM

## 2015-05-28 DIAGNOSIS — G4452 New daily persistent headache (NDPH): Secondary | ICD-10-CM

## 2015-05-28 MED ORDER — METHOCARBAMOL 500 MG PO TABS: 500.0000 mg | ORAL_TABLET | Freq: Four times a day (QID) | ORAL | Status: DC

## 2015-05-28 MED ORDER — MELOXICAM 15 MG PO TABS: 15.0000 mg | ORAL_TABLET | Freq: Every day | ORAL | Status: DC

## 2015-05-28 NOTE — Patient Instructions (Addendum)
Because you received an x-ray today, you will receive an invoice from [facility]. Please contact [facility] at [phone] with questions or concerns regarding your invoice. Our billing staff will not be able to assist you with those questions.  Degenerative Disk Disease Degenerative disk disease is a condition caused by the changes that occur in spinal disks as you grow older. Spinal disks are soft and compressible disks located between the bones of your spine (vertebrae). These disks act like shock absorbers. Degenerative disk disease can affect the whole spine. However, the neck and lower back are most commonly affected. Many changes can occur in the spinal disks with aging, such as:  The spinal disks may dry and shrink.  Small tears may occur in the tough, outer covering of the disk (annulus).  The disk space may become smaller due to loss of water.  Abnormal growths in the bone (spurs) may occur. This can put pressure on the nerve roots exiting the spinal canal, causing pain.  The spinal canal may become narrowed. RISK FACTORS   Being overweight.  Having a family history of degenerative disk disease.  Smoking.  There is increased risk if you are doing heavy lifting or have a sudden injury. SIGNS AND SYMPTOMS  Symptoms vary from person to person and may include:  Pain that varies in intensity. Some people have no pain, while others have severe pain. The location of the pain depends on the part of your backbone that is affected.  You will have neck or arm pain if a disk in the neck area is affected.  You will have pain in your back, buttocks, or legs if a disk in the lower back is affected.  Pain that becomes worse while bending, reaching up, or with twisting movements.  Pain that may start gradually and then get worse as time passes. It may also start after a major or minor injury.  Numbness or tingling in the arms or legs. DIAGNOSIS  Your health care  provider will ask you about your symptoms and about activities or habits that may cause the pain. He or she may also ask about any injuries, diseases, or treatments you have had. Your health care provider will examine you to check for the range of movement that is possible in the affected area, to check for strength in your extremities, and to check for sensation in the areas of the arms and legs supplied by different nerve roots. You may also have:   An X-ray of the spine.  Other imaging tests, such as MRI. TREATMENT  Your health care provider will advise you on the best plan for treatment. Treatment may include:  Medicines.  Rehabilitation exercises. HOME CARE INSTRUCTIONS   Follow proper lifting and walking techniques as advised by your health care provider.  Maintain good posture.  Exercise regularly as advised by your health care provider.  Perform relaxation exercises.  Change your sitting, standing, and sleeping habits as advised by your health care provider.  Change positions frequently.  Lose weight or maintain a healthy weight as advised by your health care provider.  Do not use any tobacco products, including cigarettes, chewing tobacco, or electronic cigarettes. If you need help quitting, ask your health care provider.  Wear supportive footwear.  Take medicines only as directed by your health care provider. SEEK MEDICAL CARE IF:   Your pain does not go away within 1-4 weeks.  You have significant appetite or weight loss. SEEK IMMEDIATE MEDICAL CARE IF:  Your pain is severe.  You notice weakness in your arms, hands, or legs.  You begin to lose control of your bladder or bowel movements.  You have fevers or night sweats. MAKE SURE YOU:   Understand these instructions.  Will watch your condition.  Will get help right away if you are not doing well or get worse.   This information is not intended to replace advice given to you by your health care  provider. Make sure you discuss any questions you have with your health care provider.   Document Released: 01/12/2007 Document Revised: 04/07/2014 Document Reviewed: 07/19/2013 Elsevier Interactive Patient Education [employer]

## 2015-05-28 NOTE — Progress Notes (Signed)
Subjective:    Patient ID: Renee Jordan, female    DOB: 26-Nov-1955, 60 y.o.   MRN: [id number]  05/28/2015  Headache; neck problem; and Establish Care   HPI This 60 y.o. female presents for evaluation of headache. Onset five days ago.  Head position affects headache.  Now also having neck pain.  Behind R ear and into R eye.  NO burred vision; mild photophobia.  NO phonophobia.  +nausea; no vomiting.  Turning head with bing zing of pani; nausea with sevefre pain. Pain radiating into R arm; mild weakness.  HIstory 1.5 years ago, has an electrical pain 30 seconds to 1 minute in B arms.  STunned by it.  Seveirty 5/10.  COnstant.  Turning head will worsen headache; worsens to 7/10.  Taking migraine medication, Ibuprofen ; no medication in 24 hours; flector patch yesterday; icing.  Hot shower.  Nothing helps.  Ty-pes a lot; looks at a computer.  Worreied about computer work.  Non-invasive; no surgeyr; referral for pt.  Massage.  Wants to start.  No migraines; husband with migraines.  Took Meloxicam for five years.    Goiter: Patient reports good compliance with medication, good tolerance to medication, and good symptom control.  Ballen; R thyroid gland; scan yearly.     Screening: not interested in mammogram, hemosure.    Review of Systems Per HPI   Past Medical History  Diagnosis Date  . Yeast infection   . HPV (human papilloma virus) infection   . Infertility, female   . Thyroid disease   . Shingles   . Anemia   . Joint problem   . History of chicken pox   . History of measles, mumps, or rubella   . Uterine polyp   . Arthritis    Past Surgical History  Procedure Laterality Date  . Dilation and curettage of uterus    . Laparoscopy    . Knee surgery    . Bunionectomy    . Wisdom tooth extraction    . Polyp removal     Allergies  Allergen Reactions  . Codeine Nausea And Vomiting    Social History   Social History  . Marital Status: Married    Spouse Name: N/A    . Number of Children: N/A  . Years of Education: N/A   Occupational History  . Not on file.   Social History Main Topics  . Smoking status: Never Smoker   . Smokeless tobacco: Never Used  . Alcohol Use: No  . Drug Use: No  . Sexual Activity: Yes    Birth Control/ Protection: Post-menopausal   Other Topics Concern  . Not on file   Social History Narrative   Marital status: married      Children: 1 daughter; 2 grandchildren      Lives: with husband      EMployment: [employer]     Tobacco: never      Alcohol:  None      Exercise: a lot; [employer]; hikes every weekend.   Family History  Problem Relation Age of Onset  . Heart disease Brother   . Diabetes Brother   . Hyperlipidemia Brother   . Hypertension Brother   . Hearing loss Brother 59    CAD/PTCA  . Asthma Sister   . Emphysema Mother   . Cancer Father 2    lung cancer/prostate cancer  . Hypertension Brother   . Stroke Paternal Grandfather   . Stroke Maternal Grandmother   .  Heart disease Maternal Grandfather   . Stroke Maternal Grandfather        Objective:    BP 112/84 mmHg  Pulse 56  Temp(Src) 97.9 F (36.6 C) (Oral)  Resp 16  Ht 5' 6.5" (1.689 m)  Wt 172 lb 9.6 oz (78.291 kg)  BMI 27.44 kg/m2  SpO2 98% Physical Exam  Constitutional: She is oriented to person, place, and time. She appears well-developed and well-nourished. No distress.  HENT:  Head: Normocephalic and atraumatic.  Right Ear: External ear normal.  Left Ear: External ear normal.  Nose: Nose normal.  Mouth/Throat: Oropharynx is clear and moist.  Eyes: Conjunctivae and EOM are normal. Pupils are equal, round, and reactive to light.  Neck: Normal range of motion. Neck supple. Carotid bruit is not present. No thyromegaly present.  Cardiovascular: Normal rate, regular rhythm, normal heart sounds and intact distal pulses.  Exam reveals no gallop and no friction rub.   No murmur heard. Pulmonary/Chest: Effort  normal and breath sounds normal. She has no wheezes. She has no rales.  Abdominal: Soft. Bowel sounds are normal. She exhibits no distension and no mass. There is no tenderness. There is no rebound and no guarding.  Musculoskeletal:       Right shoulder: Normal. She exhibits normal range of motion and no tenderness.       Left shoulder: Normal. She exhibits normal range of motion and no tenderness.       Cervical back: She exhibits decreased range of motion, tenderness, bony tenderness, pain and spasm. She exhibits no swelling, no edema, no deformity, no laceration and normal pulse.  Lymphadenopathy:    She has no cervical adenopathy.  Neurological: She is alert and oriented to person, place, and time. No cranial nerve deficit.  Skin: Skin is warm and dry. No rash noted. She is not diaphoretic. No erythema. No pallor.  Psychiatric: She has a normal mood and affect. Her behavior is normal.        Assessment & Plan:   1. Neck pain   2. New daily persistent headache   3. Degenerative disc disease, cervical   4. Goiter     Orders Placed This Encounter  Procedures  . DG Cervical Spine Complete    Standing Status: Future     Number of Occurrences: 1     Standing Expiration Date: 05/27/2016    Order Specific Question:  Reason for Exam (SYMPTOM  OR DIAGNOSIS REQUIRED)    Answer:  headache, cervical neck spine    Order Specific Question:  Preferred imaging location?    Answer:  External  . Ambulatory referral to Physical Therapy    Referral Priority:  Routine    Referral Type:  Physical Medicine    Referral Reason:  Specialty Services Required    Requested Specialty:  Physical Therapy    Number of Visits Requested:  1   Meds ordered this encounter  Medications  . levothyroxine (SYNTHROID, LEVOTHROID) 50 MCG tablet    Sig: Take 50 mcg by mouth daily before breakfast.  . DISCONTD: meloxicam (MOBIC) 15 MG tablet    Sig: Take 1 tablet (15 mg total) by mouth daily.    Dispense:  30  tablet    Refill:  0  . DISCONTD: methocarbamol (ROBAXIN) 500 MG tablet    Sig: Take 1-2 tablets (500-1,000 mg total) by mouth 4 (four) times daily.    Dispense:  60 tablet    Refill:  0    No Follow-up on file.  Jaivian Battaglini Elayne Guerin, M.D. [facility] [address] [place], [place]  [address] [phone] phone [phone] fax

## 2015-06-26 ENCOUNTER — Telehealth: Payer: Self-pay | Admitting: *Deleted

## 2015-06-26 ENCOUNTER — Other Ambulatory Visit: Payer: Self-pay | Admitting: Family Medicine

## 2015-06-26 NOTE — Telephone Encounter (Signed)
Renee Jordan called and stated that they needed a home traction unit for neck for patient.  [phone]

## 2015-06-27 ENCOUNTER — Telehealth: Payer: Self-pay

## 2015-06-27 ENCOUNTER — Other Ambulatory Visit: Payer: Self-pay | Admitting: Family Medicine

## 2015-06-27 DIAGNOSIS — M542 Cervicalgia: Secondary | ICD-10-CM

## 2015-06-27 NOTE — Telephone Encounter (Signed)
Patient is calling to speak to Dr. Katrinka BlazingSmith. Patient states that she had a referral for PT and now needs a referral to a spine specialist. Patient wants to know if she needs an appointment or not to have the referral done. Patient states she also needs her x-rays sent to the spine specialist if we refer her. Please call! [phone]

## 2015-06-27 NOTE — Telephone Encounter (Signed)
Referral to ortho approved.  Please advise patient.

## 2015-06-27 NOTE — Telephone Encounter (Signed)
Ok for ortho

## 2015-06-27 NOTE — Telephone Encounter (Signed)
Who called from [facility]?  I assume physical therapist?  Do we fax an order? Please elaborate on message.

## 2015-06-28 NOTE — Telephone Encounter (Signed)
Dr Katrinka BlazingSmith has just referred to ortho, but appt hasn't been scheduled yet. Will give 1 RF of each to cover her until appt.

## 2015-06-28 NOTE — Telephone Encounter (Signed)
Pt advised.

## 2015-06-30 NOTE — Telephone Encounter (Signed)
Please call Renee Jordan and asked who called about this pt and get more info.  Front office staff answered but did not get name of who called.

## 2015-07-01 MED ORDER — METHOCARBAMOL 500 MG PO TABS: 500.0000 mg | ORAL_TABLET | Freq: Four times a day (QID) | ORAL | Status: DC

## 2015-07-01 MED ORDER — MELOXICAM 15 MG PO TABS: 15.0000 mg | ORAL_TABLET | Freq: Every day | ORAL | Status: DC

## 2015-07-01 NOTE — Addendum Note (Signed)
Addended by: Ethelda ChickSMITH, KRISTI M on: 07/01/2015 11:13 AM   Modules accepted: Orders

## 2015-08-25 ENCOUNTER — Other Ambulatory Visit: Payer: Self-pay | Admitting: Family Medicine

## 2015-11-19 ENCOUNTER — Other Ambulatory Visit: Payer: Self-pay | Admitting: Endocrinology

## 2015-11-19 DIAGNOSIS — E049 Nontoxic goiter, unspecified: Secondary | ICD-10-CM

## 2015-11-21 ENCOUNTER — Other Ambulatory Visit: Payer: Self-pay

## 2015-11-21 MED ORDER — MELOXICAM 15 MG PO TABS: 15.0000 mg | ORAL_TABLET | Freq: Every day | ORAL | 0 refills | Status: DC

## 2015-11-22 ENCOUNTER — Ambulatory Visit
Admission: RE | Admit: 2015-11-22 | Discharge: 2015-11-22 | Disposition: A | Discharge status: Home / Self Care | Payer: BLUE CROSS/BLUE SHIELD | Source: Ambulatory Visit | Attending: Endocrinology | Admitting: Endocrinology

## 2015-11-22 DIAGNOSIS — E049 Nontoxic goiter, unspecified: Secondary | ICD-10-CM

## 2015-12-18 ENCOUNTER — Other Ambulatory Visit: Payer: Self-pay | Admitting: Family Medicine

## 2016-01-08 ENCOUNTER — Other Ambulatory Visit: Payer: Self-pay | Admitting: Family Medicine

## 2016-01-08 NOTE — Telephone Encounter (Signed)
05/2015 last visit, 03/2014 last labs. Pt. Advised in august she needed an appt. Refill denied. Patient advised through voice mail.

## 2016-02-28 DIAGNOSIS — Z6828 Body mass index (BMI) 28.0-28.9, adult: Secondary | ICD-10-CM | POA: Diagnosis not present

## 2016-02-28 DIAGNOSIS — E049 Nontoxic goiter, unspecified: Secondary | ICD-10-CM | POA: Diagnosis not present

## 2016-02-28 DIAGNOSIS — R319 Hematuria, unspecified: Secondary | ICD-10-CM | POA: Insufficient documentation

## 2016-02-28 DIAGNOSIS — Z01419 Encounter for gynecological examination (general) (routine) without abnormal findings: Secondary | ICD-10-CM | POA: Diagnosis not present

## 2016-02-28 DIAGNOSIS — Z124 Encounter for screening for malignant neoplasm of cervix: Secondary | ICD-10-CM | POA: Diagnosis not present

## 2016-03-19 DIAGNOSIS — M542 Cervicalgia: Secondary | ICD-10-CM | POA: Diagnosis not present

## 2016-03-19 DIAGNOSIS — M47812 Spondylosis without myelopathy or radiculopathy, cervical region: Secondary | ICD-10-CM | POA: Diagnosis not present

## 2016-04-16 ENCOUNTER — Encounter: Payer: Self-pay | Admitting: Family Medicine

## 2016-04-16 ENCOUNTER — Encounter (HOSPITAL_COMMUNITY): Payer: Self-pay | Admitting: *Deleted

## 2016-04-16 ENCOUNTER — Ambulatory Visit (HOSPITAL_COMMUNITY)
Admission: EM | Admit: 2016-04-16 | Discharge: 2016-04-16 | Disposition: A | Discharge status: Home / Self Care | Payer: BLUE CROSS/BLUE SHIELD | Attending: Family Medicine | Admitting: Family Medicine

## 2016-04-16 DIAGNOSIS — Z79899 Other long term (current) drug therapy: Secondary | ICD-10-CM | POA: Insufficient documentation

## 2016-04-16 DIAGNOSIS — J029 Acute pharyngitis, unspecified: Secondary | ICD-10-CM | POA: Diagnosis not present

## 2016-04-16 LAB — POCT RAPID STREP A: Streptococcus, Group A Screen (Direct): NEGATIVE

## 2016-04-16 MED ORDER — AMOXICILLIN 875 MG PO TABS: 875.0000 mg | ORAL_TABLET | Freq: Two times a day (BID) | ORAL | 0 refills | Status: DC

## 2016-04-16 NOTE — ED Provider Notes (Signed)
[facility]    CSN: [id number] Arrival date & time: 04/16/16  1027     History   Chief Complaint Chief Complaint  Patient presents with  . Sore Throat    HPI Renee Jordan is a 61 y.o. female.   This is a 61 year old woman who presents with a chief complaint of sore throat which began yesterday at noon. The symptoms got progressively worse. He notes the pain is acutely bad when she swallows. She's had no fever however. Several days ago she did have a runny nose but this seemed to resolve      Past Medical History:  Diagnosis Date  . Anemia   . Arthritis   . History of chicken pox   . History of measles, mumps, or rubella   . HPV (human papilloma virus) infection   . Infertility, female   . Joint problem   . Shingles   . Thyroid disease   . Uterine polyp   . Yeast infection     Patient Active Problem List   Diagnosis Date Noted  . Degenerative disc disease, cervical 01/26/2014  . Arthritis, senescent 01/26/2014  . Right lower quadrant abdominal tenderness 01/26/2014  . Menopause 01/16/2012  . Goiter 01/16/2012  . Shingles 01/16/2012    Past Surgical History:  Procedure Laterality Date  . BUNIONECTOMY    . DILATION AND CURETTAGE OF UTERUS    . KNEE SURGERY    . LAPAROSCOPY    . polyp removal    . WISDOM TOOTH EXTRACTION      OB History    Gravida Para Term Preterm AB Living   6 1     5 1    SAB TAB Ectopic Multiple Live Births   3 2             Home Medications    Prior to Admission medications   Medication Sig Start Date End Date Taking? Authorizing Provider  cyclobenzaprine (FLEXERIL) 10 MG tablet Take 10 mg by mouth 3 (three) times daily as needed for muscle spasms.   Yes Historical Provider, MD  diphenhydrAMINE (BENADRYL) 25 MG tablet Take 25 mg by mouth every 6 (six) hours as needed.   Yes Historical Provider, MD  levothyroxine (SYNTHROID, LEVOTHROID) 50 MCG tablet Take 50 mcg by mouth daily before breakfast.   Yes Historical  Provider, MD  meloxicam (MOBIC) 15 MG tablet Take 1 tablet (15 mg total) by mouth daily. 11/21/15  Yes Ethelda ChickKristi M Smith, MD  amoxicillin (AMOXIL) 875 MG tablet Take 1 tablet (875 mg total) by mouth 2 (two) times daily. 04/16/16   Elvina SidleKurt Kyeshia Zinn, MD  methocarbamol (ROBAXIN) 500 MG tablet Take 1-2 tablets (500-1,000 mg total) by mouth 4 (four) times daily. 07/01/15   Ethelda ChickKristi M Smith, MD    Family History Family History  Problem Relation Age of Onset  . Heart disease Brother   . Diabetes Brother   . Hyperlipidemia Brother   . Hypertension Brother   . Hearing loss Brother 59    CAD/PTCA  . Asthma Sister   . Emphysema Mother   . Cancer Father [id number]    lung cancer/prostate cancer  . Hypertension Brother   . Stroke Paternal Grandfather   . Stroke Maternal Grandmother   . Heart disease Maternal Grandfather   . Stroke Maternal Grandfather     Social History Social History  Substance Use Topics  . Smoking status: Never Smoker  . Smokeless tobacco: Never Used  . Alcohol use No  Allergies   Codeine   Review of Systems Review of Systems  Constitutional: Negative.   HENT: Positive for sore throat.   Respiratory: Negative.   Cardiovascular: Negative.   Gastrointestinal: Negative.   Genitourinary: Negative.   Neurological: Negative.      Physical Exam Triage Vital Signs ED Triage Vitals  Enc Vitals Group     BP 04/16/16 1055 154/76     Pulse Rate 04/16/16 1055 79     Resp 04/16/16 1055 17     Temp 04/16/16 1055 98.1 F (36.7 C)     Temp Source 04/16/16 1055 Oral     SpO2 04/16/16 1055 100 %     Weight --      Height --      Head Circumference --      Peak Flow --      Pain Score 04/16/16 1057 6     Pain Loc --      Pain Edu? --      Excl. in GC? --    No data found.   Updated Vital Signs BP 154/76 (BP Location: Right Arm)   Pulse 79   Temp 98.1 F (36.7 C) (Oral)   Resp 17   SpO2 100%    Physical Exam  Constitutional: She is oriented to person, place, and  time. She appears well-developed and well-nourished.  HENT:  Head: Normocephalic.  Right Ear: External ear normal.  Left Ear: External ear normal.  Reddened posterior pharynx  Eyes: Conjunctivae are normal.  Neck: Normal range of motion. Neck supple.  Pulmonary/Chest: Effort normal.  Musculoskeletal: Normal range of motion.  Neurological: She is alert and oriented to person, place, and time.  Skin: Skin is warm and dry.  Psychiatric: She has a normal mood and affect.  Nursing note and vitals reviewed.    UC Treatments / Results  Labs (all labs ordered are listed, but only abnormal results are displayed) Labs Reviewed - No data to display  EKG  EKG Interpretation None       Radiology No results found.  Procedures Procedures (including critical care time)  Medications Ordered in UC Medications - No data to display   Initial Impression / Assessment and Plan / UC Course  I have reviewed the triage vital signs and the nursing notes.  Pertinent labs & imaging results that were available during my care of the patient were reviewed by me and considered in my medical decision making (see chart for details).  Clinical Course      Final Clinical Impressions(s) / UC Diagnoses   Final diagnoses:  Pharyngitis, unspecified etiology    New Prescriptions New Prescriptions   AMOXICILLIN (AMOXIL) 875 MG TABLET    Take 1 tablet (875 mg total) by mouth 2 (two) times daily.     Elvina Sidle, MD 04/16/16 1113

## 2016-04-16 NOTE — ED Triage Notes (Signed)
Patient reports severe sore throat since yesterday-unsure of fevers. No other symptoms associated.

## 2016-04-18 LAB — CULTURE, GROUP A STREP (THRC)

## 2016-04-21 ENCOUNTER — Encounter: Payer: Self-pay | Admitting: Family Medicine

## 2016-07-11 DIAGNOSIS — M542 Cervicalgia: Secondary | ICD-10-CM | POA: Diagnosis not present

## 2016-07-11 DIAGNOSIS — M47812 Spondylosis without myelopathy or radiculopathy, cervical region: Secondary | ICD-10-CM | POA: Diagnosis not present

## 2016-07-11 DIAGNOSIS — R51 Headache: Secondary | ICD-10-CM | POA: Diagnosis not present

## 2016-07-28 DIAGNOSIS — M47812 Spondylosis without myelopathy or radiculopathy, cervical region: Secondary | ICD-10-CM | POA: Diagnosis not present

## 2016-07-28 DIAGNOSIS — M542 Cervicalgia: Secondary | ICD-10-CM | POA: Diagnosis not present

## 2016-07-28 DIAGNOSIS — R51 Headache: Secondary | ICD-10-CM | POA: Diagnosis not present

## 2016-08-08 DIAGNOSIS — M47812 Spondylosis without myelopathy or radiculopathy, cervical region: Secondary | ICD-10-CM | POA: Diagnosis not present

## 2016-08-08 DIAGNOSIS — R51 Headache: Secondary | ICD-10-CM | POA: Diagnosis not present

## 2016-08-08 DIAGNOSIS — M542 Cervicalgia: Secondary | ICD-10-CM | POA: Diagnosis not present

## 2016-08-18 DIAGNOSIS — M542 Cervicalgia: Secondary | ICD-10-CM | POA: Diagnosis not present

## 2016-08-18 DIAGNOSIS — R51 Headache: Secondary | ICD-10-CM | POA: Diagnosis not present

## 2016-08-18 DIAGNOSIS — M47812 Spondylosis without myelopathy or radiculopathy, cervical region: Secondary | ICD-10-CM | POA: Diagnosis not present

## 2016-08-18 DIAGNOSIS — M50822 Other cervical disc disorders at C5-C6 level: Secondary | ICD-10-CM | POA: Diagnosis not present

## 2016-08-22 DIAGNOSIS — M542 Cervicalgia: Secondary | ICD-10-CM | POA: Diagnosis not present

## 2016-09-04 ENCOUNTER — Encounter: Payer: Self-pay | Admitting: Emergency Medicine

## 2016-09-04 ENCOUNTER — Ambulatory Visit (INDEPENDENT_AMBULATORY_CARE_PROVIDER_SITE_OTHER): Payer: BLUE CROSS/BLUE SHIELD | Admitting: Emergency Medicine

## 2016-09-04 VITALS — BP 123/71 | HR 76 | Temp 98.6°F | Resp 16 | Ht 65.5 in | Wt 178.2 lb

## 2016-09-04 DIAGNOSIS — S90424A Blister (nonthermal), right lesser toe(s), initial encounter: Secondary | ICD-10-CM | POA: Insufficient documentation

## 2016-09-04 DIAGNOSIS — L03031 Cellulitis of right toe: Secondary | ICD-10-CM | POA: Insufficient documentation

## 2016-09-04 MED ORDER — CEFADROXIL 500 MG PO CAPS
500.0000 mg | ORAL_CAPSULE | Freq: Two times a day (BID) | ORAL | 0 refills | Status: AC
Start: 2016-09-04 — End: 2016-09-11

## 2016-09-04 NOTE — Patient Instructions (Addendum)
     IF you received an x-ray today, you will receive an invoice from [facility]. Please contact [facility] at [phone] with questions or concerns regarding your invoice.   IF you received labwork today, you will receive an invoice from [facility]. Please contact [facility] at [phone] with questions or concerns regarding your invoice.   Our billing staff will not be able to assist you with questions regarding bills from these companies.  You will be contacted with the lab results as soon as they are available. The fastest way to get your results is to activate your My Chart account. Instructions are located on the last page of this paperwork. If you have not heard from us regarding the results in 2 weeks, please contact this office.    We recommend that you schedule a mammogram for breast cancer screening. Typically, you do not need a referral to do this. Please contact a local imaging center to schedule your mammogram.  [facility] - [phone]  *ask for the Radiology Department [facility]) - [phone]) [phone] or [phone]  [facility] - [phone]) [phone] [facility] - [phone] [facility]  - [phone]) [phone]  *ask for the Radiology Department [facility] - [phone]  *ask for the Radiology Department [facility] - [phone]) [phone]  *ask for the Mammography Department [facility] - [phone](336) [phone] Cellulitis, Adult Cellulitis is a skin infection. The infected area is usually red and sore. This condition occurs most often in the arms and lower legs. It is very important to get treated for this condition. Follow these instructions at home:  Take over-the-counter and prescription medicines only as told by your doctor.  If you were prescribed an antibiotic medicine, take it as told by your doctor. Do not stop taking the antibiotic even if you start to feel  better.  Drink enough fluid to keep your pee (urine) clear or pale yellow.  Do not touch or rub the infected area.  Raise (elevate) the infected area above the level of your heart while you are sitting or lying down.  Place warm or cold wet cloths (warm or cold compresses) on the infected area. Do this as told by your doctor.  Keep all follow-up visits as told by your doctor. This is important. These visits let your doctor make sure your infection is not getting worse. Contact a doctor if:  You have a fever.  Your symptoms do not get better after 1-2 days of treatment.  Your bone or joint under the infected area starts to hurt after the skin has healed.  Your infection comes back. This can happen in the same area or another area.  You have a swollen bump in the infected area.  You have new symptoms.  You feel ill and also have muscle aches and pains. Get help right away if:  Your symptoms get worse.  You feel very sleepy.  You throw up (vomit) or have watery poop (diarrhea) for a long time.  There are red streaks coming from the infected area.  Your red area gets larger.  Your red area turns darker. This information is not intended to replace advice given to you by your health care provider. Make sure you discuss any questions you have with your health care provider. Document Released: 09/03/2007 Document Revised: 08/23/2015 Document Reviewed: 01/24/2015 Elsevier Interactive Patient Education  2018 [employer]

## 2016-09-04 NOTE — Progress Notes (Signed)
Renee Jordan 61 y.o.   Chief Complaint  Patient presents with  . Blister    RIGHT FOOT 2nd TOE since Tuesday 09/02/2016    HISTORY OF PRESENT ILLNESS: This is a 61 y.o. female complaining of infected blister to second toe of right foot.  HPI   Prior to Admission medications   Medication Sig Start Date End Date Taking? Authorizing Provider  cyclobenzaprine (FLEXERIL) 10 MG tablet Take 10 mg by mouth 3 (three) times daily as needed for muscle spasms.   Yes [provider]  diphenhydrAMINE (BENADRYL) 25 MG tablet Take 25 mg by mouth every 6 (six) hours as needed.   Yes [provider]  levothyroxine (SYNTHROID, LEVOTHROID) 50 MCG tablet Take 50 mcg by mouth daily before breakfast.   Yes [provider]  meloxicam (MOBIC) 15 MG tablet Take 1 tablet (15 mg total) by mouth daily. 11/21/15  Yes Ethelda Chick, MD  cefadroxil (DURICEF) 500 MG capsule Take 1 capsule (500 mg total) by mouth 2 (two) times daily. 09/04/16 09/11/16  Georgina Quint, MD  methocarbamol (ROBAXIN) 500 MG tablet Take 1-2 tablets (500-1,000 mg total) by mouth 4 (four) times daily. Patient not taking: Reported on 09/04/2016 07/01/15   Ethelda Chick, MD    Allergies  Allergen Reactions  . Codeine Nausea And Vomiting    Patient Active Problem List   Diagnosis Date Noted  . Cellulitis of second toe of right foot 09/04/2016  . Blister of second toe of right foot 09/04/2016  . Degenerative disc disease, cervical 01/26/2014  . Arthritis, senescent 01/26/2014  . Right lower quadrant abdominal tenderness 01/26/2014  . Menopause 01/16/2012  . Goiter 01/16/2012  . Shingles 01/16/2012    Past Medical History:  Diagnosis Date  . Anemia   . Arthritis   . History of chicken pox   . History of measles, mumps, or rubella   . HPV (human papilloma virus) infection   . Infertility, female   . Joint problem   . Shingles   . Thyroid disease   . Uterine polyp   . Yeast infection      Past Surgical History:  Procedure Laterality Date  . BUNIONECTOMY    . DILATION AND CURETTAGE OF UTERUS    . KNEE SURGERY    . LAPAROSCOPY    . polyp removal    . WISDOM TOOTH EXTRACTION      Social History   Social History  . Marital status: Married    Spouse name: N/A  . Number of children: N/A  . Years of education: N/A   Occupational History  . Not on file.   Social History Main Topics  . Smoking status: Never Smoker  . Smokeless tobacco: Never Used  . Alcohol use No  . Drug use: No  . Sexual activity: Yes    Birth control/ protection: Post-menopausal   Other Topics Concern  . Not on file   Social History Narrative   Marital status: married      Children: 1 daughter; 2 grandchildren      Lives: with husband      EMployment: [employer]     Tobacco: never      Alcohol:  None      Exercise: a lot; [employer]; hikes every weekend.    Family History  Problem Relation Age of Onset  . Heart disease Brother   . Diabetes Brother   . Hyperlipidemia Brother   . Hypertension Brother   .  Hearing loss Brother 59       CAD/PTCA  . Asthma Sister   . Emphysema Mother   . Cancer Father [id number]       lung cancer/prostate cancer  . Hypertension Brother   . Stroke Paternal Grandfather   . Stroke Maternal Grandmother   . Heart disease Maternal Grandfather   . Stroke Maternal Grandfather      Review of Systems  Constitutional: Negative for chills and fever.  Respiratory: Negative for shortness of breath.   Cardiovascular: Negative for chest pain.  Gastrointestinal: Negative for nausea and vomiting.  Skin: Negative for rash.  Neurological: Negative for dizziness and headaches.  All other systems reviewed and are negative.  Vitals:   09/04/16 1431  BP: 123/71  Pulse: 76  Resp: 16  Temp: 98.6 F (37 C)     Physical Exam  Constitutional: She is oriented to person, place, and time. She appears well-developed and well-nourished.   HENT:  Head: Normocephalic and atraumatic.  Eyes: EOM are normal. Pupils are equal, round, and reactive to light.  Neck: Normal range of motion. Neck supple.  Cardiovascular: Normal rate and regular rhythm.   Pulmonary/Chest: Effort normal.  Musculoskeletal:  Right foot: +blister distally with erythema and swelling c/w infection; NVI with good cap refill.  Neurological: She is alert and oriented to person, place, and time.  Skin: Skin is warm and dry. Capillary refill takes less than 2 seconds.  Psychiatric: She has a normal mood and affect. Her behavior is normal.  Vitals reviewed.    ASSESSMENT & PLAN: Renee Jordan was seen today for blister.  Diagnoses and all orders for this visit:  Cellulitis of second toe of right foot  Blister of second toe of right foot, initial encounter  Other orders -     cefadroxil (DURICEF) 500 MG capsule; Take 1 capsule (500 mg total) by mouth 2 (two) times daily.    Patient Instructions       IF you received an x-ray today, you will receive an invoice from [facility]. Please contact [facility] at [phone] with questions or concerns regarding your invoice.   IF you received labwork today, you will receive an invoice from [facility]. Please contact [facility] at [phone] with questions or concerns regarding your invoice.   Our billing staff will not be able to assist you with questions regarding bills from these companies.  You will be contacted with the lab results as soon as they are available. The fastest way to get your results is to activate your My Chart account. Instructions are located on the last page of this paperwork. If you have not heard from us regarding the results in 2 weeks, please contact this office.    We recommend that you schedule a mammogram for breast cancer screening. Typically, you do not need a referral to do this. Please contact a local imaging center to schedule your mammogram.  [facility] - [phone]  *ask for the Radiology Department [facility]) - [phone] or [phone]  [facility] - [phone]) [phone] [facility] - [phone] [facility] - [phone]) [phone]  *ask for the Radiology Department [facility] - [phone]) [phone]  *ask for the Radiology Department [facility] - [phone]  *ask for the Mammography Department [facility] - [phone]) [phone] Cellulitis, Adult Cellulitis is a skin infection. The infected area is usually red and sore. This condition occurs most often in the arms  and lower legs. It is very important to get treated for this condition. Follow these instructions at home:  Take over-the-counter and prescription medicines only as told by your doctor.  If you were prescribed an antibiotic medicine, take it as told by your doctor. Do not stop taking the antibiotic even if you start to feel better.  Drink enough fluid to keep your pee (urine) clear or pale yellow.  Do not touch or rub the infected area.  Raise (elevate) the infected area above the level of your heart while you are sitting or lying down.  Place warm or cold wet cloths (warm or cold compresses) on the infected area. Do this as told by your doctor.  Keep all follow-up visits as told by your doctor. This is important. These visits let your doctor make sure your infection is not getting worse. Contact a doctor if:  You have a fever.  Your symptoms do not get better after 1-2 days of treatment.  Your bone or joint under the infected area starts to hurt after the skin has healed.  Your infection comes back. This can happen in the same area or another area.  You have a swollen bump in the infected area.  You have new symptoms.  You feel ill and also have muscle aches and pains. Get help right away if:  Your symptoms get worse.  You feel very sleepy.  You throw up (vomit) or  have watery poop (diarrhea) for a long time.  There are red streaks coming from the infected area.  Your red area gets larger.  Your red area turns darker. This information is not intended to replace advice given to you by your health care provider. Make sure you discuss any questions you have with your health care provider. Document Released: 09/03/2007 Document Revised: 08/23/2015 Document Reviewed: 01/24/2015 Elsevier Interactive Patient Education  2018 Elsevier Inc.      Edwina Barth, MD [facility]

## 2016-09-26 DIAGNOSIS — M47812 Spondylosis without myelopathy or radiculopathy, cervical region: Secondary | ICD-10-CM | POA: Diagnosis not present

## 2016-09-26 DIAGNOSIS — M50822 Other cervical disc disorders at C5-C6 level: Secondary | ICD-10-CM | POA: Diagnosis not present

## 2016-09-26 DIAGNOSIS — M542 Cervicalgia: Secondary | ICD-10-CM | POA: Diagnosis not present

## 2016-10-20 DIAGNOSIS — M47812 Spondylosis without myelopathy or radiculopathy, cervical region: Secondary | ICD-10-CM | POA: Diagnosis not present

## 2016-10-20 DIAGNOSIS — M542 Cervicalgia: Secondary | ICD-10-CM | POA: Diagnosis not present

## 2016-10-20 DIAGNOSIS — R51 Headache: Secondary | ICD-10-CM | POA: Diagnosis not present

## 2016-12-10 DIAGNOSIS — E039 Hypothyroidism, unspecified: Secondary | ICD-10-CM | POA: Diagnosis not present

## 2017-01-23 ENCOUNTER — Telehealth: Payer: Self-pay

## 2017-01-23 NOTE — Telephone Encounter (Signed)
Tried to call patient to advise of overdue health maintenance.  LMVM to CB at my direct line.

## 2017-01-30 ENCOUNTER — Ambulatory Visit (INDEPENDENT_AMBULATORY_CARE_PROVIDER_SITE_OTHER): Payer: BLUE CROSS/BLUE SHIELD | Admitting: Family Medicine

## 2017-01-30 ENCOUNTER — Encounter: Payer: Self-pay | Admitting: Family Medicine

## 2017-01-30 VITALS — BP 129/78 | HR 82 | Temp 97.6°F | Resp 16 | Ht 67.32 in | Wt 182.0 lb

## 2017-01-30 DIAGNOSIS — Z1211 Encounter for screening for malignant neoplasm of colon: Secondary | ICD-10-CM

## 2017-01-30 DIAGNOSIS — E049 Nontoxic goiter, unspecified: Secondary | ICD-10-CM

## 2017-01-30 DIAGNOSIS — Z0000 Encounter for general adult medical examination without abnormal findings: Secondary | ICD-10-CM

## 2017-01-30 DIAGNOSIS — Z780 Asymptomatic menopausal state: Secondary | ICD-10-CM | POA: Diagnosis not present

## 2017-01-30 DIAGNOSIS — Z23 Encounter for immunization: Secondary | ICD-10-CM

## 2017-01-30 DIAGNOSIS — Z114 Encounter for screening for human immunodeficiency virus [HIV]: Secondary | ICD-10-CM

## 2017-01-30 DIAGNOSIS — Z131 Encounter for screening for diabetes mellitus: Secondary | ICD-10-CM

## 2017-01-30 DIAGNOSIS — Z1159 Encounter for screening for other viral diseases: Secondary | ICD-10-CM

## 2017-01-30 DIAGNOSIS — Z13220 Encounter for screening for lipoid disorders: Secondary | ICD-10-CM

## 2017-01-30 DIAGNOSIS — M5030 Other cervical disc degeneration, unspecified cervical region: Secondary | ICD-10-CM

## 2017-01-30 LAB — POCT URINALYSIS DIP (MANUAL ENTRY)
Bilirubin, UA: NEGATIVE
GLUCOSE UA: NEGATIVE mg/dL
Ketones, POC UA: NEGATIVE mg/dL
LEUKOCYTES UA: NEGATIVE
NITRITE UA: NEGATIVE
PROTEIN UA: NEGATIVE mg/dL
Spec Grav, UA: 1.015 (ref 1.010–1.025)
UROBILINOGEN UA: 0.2 U/dL
pH, UA: 6 (ref 5.0–8.0)

## 2017-01-30 MED ORDER — ZOSTER VAC RECOMB ADJUVANTED 50 MCG/0.5ML IM SUSR: 0.5000 mL | Freq: Once | INTRAMUSCULAR | 1 refills | Status: AC

## 2017-01-30 MED ORDER — CYCLOBENZAPRINE HCL 10 MG PO TABS: 10.0000 mg | ORAL_TABLET | Freq: Three times a day (TID) | ORAL | 1 refills | Status: DC | PRN

## 2017-01-30 MED ORDER — MELOXICAM 15 MG PO TABS: 15.0000 mg | ORAL_TABLET | Freq: Every day | ORAL | 0 refills | Status: DC

## 2017-01-30 NOTE — Patient Instructions (Addendum)
IF you received an x-ray today, you will receive an invoice from [facility]. Please contact [facility] at [phone] with questions or concerns regarding your invoice.   IF you received labwork today, you will receive an invoice from [facility]. Please contact [facility] at [phone] with questions or concerns regarding your invoice.   Our billing staff will not be able to assist you with questions regarding bills from these companies.  You will be contacted with the lab results as soon as they are available. The fastest way to get your results is to activate your My Chart account. Instructions are located on the last page of this paperwork. If you have not heard from [place] regarding the results in 2 weeks, please contact this office.      Preventive Care 40-64 Years, Female Preventive care refers to lifestyle choices and visits with your health care provider that can promote health and wellness. What does preventive care include?  A yearly physical exam. This is also called an annual well check.  Dental exams once or twice a year.  Routine eye exams. Ask your health care provider how often you should have your eyes checked.  Personal lifestyle choices, including: ? Daily care of your teeth and gums. ? Regular physical activity. ? Eating a healthy diet. ? Avoiding tobacco and drug use. ? Limiting alcohol use. ? Practicing safe sex. ? Taking low-dose aspirin daily starting at age 34. ? Taking vitamin and mineral supplements as recommended by your health care provider. What happens during an annual well check? The services and screenings done by your health care provider during your annual well check will depend on your age, overall health, lifestyle risk factors, and family history of disease. Counseling Your health care provider may ask you questions about your:  Alcohol use.  Tobacco use.  Drug use.  Emotional well-being.  Home and relationship  well-being.  Sexual activity.  Eating habits.  Work and work Statistician.  Method of birth control.  Menstrual cycle.  Pregnancy history.  Screening You may have the following tests or measurements:  Height, weight, and BMI.  Blood pressure.  Lipid and cholesterol levels. These may be checked every 5 years, or more frequently if you are over 65 years old.  Skin check.  Lung cancer screening. You may have this screening every year starting at age 102 if you have a 30-pack-year history of smoking and currently smoke or have quit within the past 15 years.  Fecal occult blood test (FOBT) of the stool. You may have this test every year starting at age 33.  Flexible sigmoidoscopy or colonoscopy. You may have a sigmoidoscopy every 5 years or a colonoscopy every 10 years starting at age 23.  Hepatitis C blood test.  Hepatitis B blood test.  Sexually transmitted disease (STD) testing.  Diabetes screening. This is done by checking your blood sugar (glucose) after you have not eaten for a while (fasting). You may have this done every 1-3 years.  Mammogram. This may be done every 1-2 years. Talk to your health care provider about when you should start having regular mammograms. This may depend on whether you have a family history of breast cancer.  BRCA-related cancer screening. This may be done if you have a family history of breast, ovarian, tubal, or peritoneal cancers.  Pelvic exam and Pap test. This may be done every 3 years starting at age 3. Starting at age 55, this may be done every 5 years if  you have a Pap test in combination with an HPV test.  Bone density scan. This is done to screen for osteoporosis. You may have this scan if you are at high risk for osteoporosis.  Discuss your test results, treatment options, and if necessary, the need for more tests with your health care provider. Vaccines Your health care provider may recommend certain vaccines, such  as:  Influenza vaccine. This is recommended every year.  Tetanus, diphtheria, and acellular pertussis (Tdap, Td) vaccine. You may need a Td booster every 10 years.  Varicella vaccine. You may need this if you have not been vaccinated.  Zoster vaccine. You may need this after age 70.  Measles, mumps, and rubella (MMR) vaccine. You may need at least one dose of MMR if you were born in 1957 or later. You may also need a second dose.  Pneumococcal 13-valent conjugate (PCV13) vaccine. You may need this if you have certain conditions and were not previously vaccinated.  Pneumococcal polysaccharide (PPSV23) vaccine. You may need one or two doses if you smoke cigarettes or if you have certain conditions.  Meningococcal vaccine. You may need this if you have certain conditions.  Hepatitis A vaccine. You may need this if you have certain conditions or if you travel or work in places where you may be exposed to hepatitis A.  Hepatitis B vaccine. You may need this if you have certain conditions or if you travel or work in places where you may be exposed to hepatitis B.  Haemophilus influenzae type b (Hib) vaccine. You may need this if you have certain conditions.  Talk to your health care provider about which screenings and vaccines you need and how often you need them. This information is not intended to replace advice given to you by your health care provider. Make sure you discuss any questions you have with your health care provider. Document Released: 04/13/2015 Document Revised: 12/05/2015 Document Reviewed: 01/16/2015 Elsevier Interactive Patient Education  2017 [employer].

## 2017-01-30 NOTE — Progress Notes (Signed)
Subjective:    Patient ID: Renee Jordan, female    DOB: [birth year], 61 y.o.   MRN: [id number]  01/30/2017  Annual Exam    HPI This 61 y.o. female presents for Complete Physical Examination.  Last physical:  01-16-2012 Pap smear:  01-16-2012; Lowell Guitar at [facility] Mammogram:  02-24-2006; scheduled in December 2018. Colonoscopy:  None; refuses; happy to discuss cologuard Bone density:  2006 Eye exam:  Glasses; recent. Dental exam:  Frequently.   DDD cervical: followed by Maurice Small; physical therapy helped with neck pain and headaches; Ibazebo started performing injections.  Complex of issues causing headaches; was getting relief from injections.   Then gradually quit working; refused referral to neurology. Also needed oral surgery; pulled tooh and headache went away.  Now only taking Meloxicam every 3 days; not taking Tylenol.    Hypothyroidism: Ballen yearly; no recent thyroid US.     Visual Acuity Screening   Right eye Left eye Both eyes  Without correction:     With correction: 20/20 20/20 20/20     BP Readings from Last 3 Encounters:  01/30/17 129/78  09/04/16 123/71  04/16/16 154/76   Wt Readings from Last 3 Encounters:  01/30/17 182 lb (82.6 kg)  09/04/16 178 lb 3.2 oz (80.8 kg)  05/28/15 172 lb 9.6 oz (78.3 kg)   Immunization History  Administered Date(s) Administered  . Influenza,inj,Quad PF,6+ Mos 01/30/2017  . Td 09/04/2005  . Tdap 01/30/2017    Review of Systems  Constitutional: Negative for activity change, appetite change, chills, diaphoresis, fatigue, fever and unexpected weight change.  HENT: Negative for congestion, dental problem, drooling, ear discharge, ear pain, facial swelling, hearing loss, mouth sores, nosebleeds, postnasal drip, rhinorrhea, sinus pressure, sneezing, sore throat, tinnitus, trouble swallowing and voice change.   Eyes: Negative for photophobia, pain, discharge, redness, itching and visual disturbance.    Respiratory: Negative for apnea, cough, choking, chest tightness, shortness of breath, wheezing and stridor.   Cardiovascular: Negative for chest pain, palpitations and leg swelling.  Gastrointestinal: Negative for abdominal distention, abdominal pain, anal bleeding, blood in stool, constipation, diarrhea, nausea, rectal pain and vomiting.  Endocrine: Negative for cold intolerance, heat intolerance, polydipsia, polyphagia and polyuria.  Genitourinary: Negative for decreased urine volume, difficulty urinating, dyspareunia, dysuria, enuresis, flank pain, frequency, genital sores, hematuria, menstrual problem, pelvic pain, urgency, vaginal bleeding, vaginal discharge and vaginal pain.       Nocturia x 1.  Urinary leakage stress.  Musculoskeletal: Negative for arthralgias, back pain, gait problem, joint swelling, myalgias, neck pain and neck stiffness.  Skin: Negative for color change, pallor, rash and wound.  Allergic/Immunologic: Negative for environmental allergies, food allergies and immunocompromised state.  Neurological: Negative for dizziness, tremors, seizures, syncope, facial asymmetry, speech difficulty, weakness, light-headedness, numbness and headaches.  Hematological: Negative for adenopathy. Does not bruise/bleed easily.  Psychiatric/Behavioral: Negative for agitation, behavioral problems, confusion, decreased concentration, dysphoric mood, hallucinations, self-injury, sleep disturbance and suicidal ideas. The patient is not nervous/anxious and is not hyperactive.        Bedtime 10-12; wakes up 7-8.    Past Medical History:  Diagnosis Date  . Anemia   . Arthritis   . History of chicken pox   . History of measles, mumps, or rubella   . HPV (human papilloma virus) infection   . Infertility, female   . Joint problem   . Shingles   . Thyroid disease   . Uterine polyp   . Yeast infection    Past Surgical History:  Procedure Laterality Date  . BUNIONECTOMY    . DILATION AND  CURETTAGE OF UTERUS    . KNEE SURGERY    . LAPAROSCOPY    . polyp removal    . WISDOM TOOTH EXTRACTION     Allergies  Allergen Reactions  . Codeine Nausea And Vomiting   Current Outpatient Medications on File Prior to Visit  Medication Sig Dispense Refill  . diphenhydrAMINE (BENADRYL) 25 MG tablet Take 25 mg by mouth every 6 (six) hours as needed.    Marland Kitchen. levothyroxine (SYNTHROID, LEVOTHROID) 50 MCG tablet Take 50 mcg by mouth daily before breakfast.     No current facility-administered medications on file prior to visit.    Social History   Socioeconomic History  . Marital status: Married    Spouse name: Not on file  . Number of children: Not on file  . Years of education: Not on file  . Highest education level: Not on file  Social Needs  . Financial resource strain: Not on file  . Food insecurity - worry: Not on file  . Food insecurity - inability: Not on file  . Transportation needs - medical: Not on file  . Transportation needs - non-medical: Not on file  Occupational History  . Not on file  Tobacco Use  . Smoking status: Never Smoker  . Smokeless tobacco: Never Used  Substance and Sexual Activity  . Alcohol use: No  . Drug use: No  . Sexual activity: Yes    Birth control/protection: Post-menopausal  Other Topics Concern  . Not on file  Social History Narrative   Marital status: married x 21 years      Children: 1 daughter [id number]; 2 grandchildren (683,8)      Lives: with husband      EMployment: [employer]; full time; happy     Tobacco: never      Alcohol:  None      Exercise: a lot; [employer]; hikes every weekend. 2-3 times per week; hikes a lot; rides bike. Swims weekly.   Family History  Problem Relation Age of Onset  . Heart disease Brother   . Diabetes Brother   . Hyperlipidemia Brother   . Hypertension Brother   . Hearing loss Brother 59       CAD/PTCA  . Asthma Sister   . Emphysema Mother   . Cancer Father [id number]       lung  cancer/prostate cancer  . Hypertension Brother   . Stroke Paternal Grandfather   . Stroke Maternal Grandmother   . Heart disease Maternal Grandfather   . Stroke Maternal Grandfather        Objective:    BP 129/78   Pulse 82   Temp 97.6 F (36.4 C) (Oral)   Resp 16   Ht 5' 7.32" (1.71 m)   Wt 182 lb (82.6 kg)   SpO2 96%   BMI 28.23 kg/m  Physical Exam  Constitutional: She is oriented to person, place, and time. She appears well-developed and well-nourished. No distress.  HENT:  Head: Normocephalic and atraumatic.  Right Ear: External ear normal.  Left Ear: External ear normal.  Nose: Nose normal.  Mouth/Throat: Oropharynx is clear and moist.  Eyes: Pupils are equal, round, and reactive to light. Conjunctivae and EOM are normal.  Neck: Normal range of motion and full passive range of motion without pain. Neck supple. No JVD present. Carotid bruit is not present. No thyromegaly present.  Cardiovascular: Normal rate, regular rhythm  and normal heart sounds.  Exam reveals no gallop and no friction rub.   No murmur heard. Pulmonary/Chest: Effort normal and breath sounds normal. She has no wheezes. She has no rales.  Abdominal: Soft. Bowel sounds are normal. She exhibits no distension and no mass. There is no tenderness. There is no rebound and no guarding.  Musculoskeletal:       Right shoulder: Normal.       Left shoulder: Normal.       Cervical back: Normal.  Lymphadenopathy:    She has no cervical adenopathy.  Neurological: She is alert and oriented to person, place, and time. She has normal reflexes. No cranial nerve deficit. She exhibits normal muscle tone. Coordination normal.  Skin: Skin is warm and dry. No rash noted. She is not diaphoretic. No erythema. No pallor.  Psychiatric: She has a normal mood and affect. Her behavior is normal. Judgment and thought content normal.  Nursing note and vitals reviewed.  No results found. Depression screen [facility] 2/9 09/04/2016  02/22/2014 01/26/2014  Decreased Interest 0 0 0  Down, Depressed, Hopeless 0 0 0  PHQ - 2 Score 0 0 0   Fall Risk  09/04/2016 02/22/2014 01/26/2014  Falls in the past year? No No No        Assessment & Plan:   1. Routine physical examination   2. Screening for colon cancer   3. Need for shingles vaccine   4. Need for Tdap vaccination   5. Screening for diabetes mellitus   6. Screening, lipid   7. Need for hepatitis C screening test   8. Screening for HIV (human immunodeficiency virus)   9. Need for prophylactic vaccination and inoculation against influenza   10. Degenerative disc disease, cervical   11. Goiter   12. Menopause     -anticipatory guidance provided --- exercise, weight loss, safe driving practices, aspirin 81mg  daily. -obtain age appropriate screening labs and labs for chronic disease management. -refills of Flexeril and Meloxicam provided for DDD cervical spine.   Orders Placed This Encounter  Procedures  . Tdap vaccine greater than or equal to 7yo IM  . Flu Vaccine QUAD 36+ mos IM  . CBC with Differential/Platelet  . Hemoglobin A1c  . Lipid panel    Order Specific Question:   Has the patient fasted?    Answer:   No  . Hepatitis C antibody  . HIV antibody  . Comprehensive metabolic panel    Order Specific Question:   Has the patient fasted?    Answer:   No  . Cologuard  . POCT urinalysis dipstick   Meds ordered this encounter  Medications  . Zoster Vaccine Adjuvanted [facility]) injection    Sig: Inject 0.5 mLs into the muscle once.    Dispense:  0.5 mL    Refill:  1  . meloxicam (MOBIC) 15 MG tablet    Sig: Take 1 tablet (15 mg total) by mouth daily.    Dispense:  90 tablet    Refill:  0  . cyclobenzaprine (FLEXERIL) 10 MG tablet    Sig: Take 1 tablet (10 mg total) by mouth 3 (three) times daily as needed for muscle spasms.    Dispense:  90 tablet    Refill:  1    Return in about 1 year (around 01/30/2018) for complete physical  examiniation.   Abygayle Deltoro Paulita Fujita, M.D. Primary Care at [facility] [address] [place], [place]  [address] [phone] phone [phone] fax

## 2017-01-31 LAB — COMPREHENSIVE METABOLIC PANEL
ALBUMIN: 4.3 g/dL (ref 3.6–4.8)
ALT: 14 IU/L (ref 0–32)
AST: 19 IU/L (ref 0–40)
Albumin/Globulin Ratio: 1.7 (ref 1.2–2.2)
Alkaline Phosphatase: 72 IU/L (ref 39–117)
BILIRUBIN TOTAL: 0.3 mg/dL (ref 0.0–1.2)
BUN/Creatinine Ratio: 17 (ref 12–28)
BUN: 17 mg/dL (ref 8–27)
CALCIUM: 9.4 mg/dL (ref 8.7–10.3)
CHLORIDE: 104 mmol/L (ref 96–106)
CO2: 22 mmol/L (ref 20–29)
CREATININE: 1.01 mg/dL — AB (ref 0.57–1.00)
GFR, EST AFRICAN AMERICAN: 69 mL/min/{1.73_m2} (ref >59)
GFR, EST NON AFRICAN AMERICAN: 60 mL/min/{1.73_m2} (ref >59)
GLUCOSE: 105 mg/dL — AB (ref 65–99)
Globulin, Total: 2.5 g/dL (ref 1.5–4.5)
Potassium: 4.3 mmol/L (ref 3.5–5.2)
Sodium: 141 mmol/L (ref 134–144)
TOTAL PROTEIN: 6.8 g/dL (ref 6.0–8.5)

## 2017-01-31 LAB — CBC WITH DIFFERENTIAL/PLATELET
BASOS ABS: 0.1 10*3/uL (ref 0.0–0.2)
Basos: 1 %
EOS (ABSOLUTE): 0.5 10*3/uL — ABNORMAL HIGH (ref 0.0–0.4)
EOS: 9 %
HEMATOCRIT: 42.4 % (ref 34.0–46.6)
HEMOGLOBIN: 14.3 g/dL (ref 11.1–15.9)
IMMATURE GRANS (ABS): 0 10*3/uL (ref 0.0–0.1)
Immature Granulocytes: 0 %
LYMPHS ABS: 2.2 10*3/uL (ref 0.7–3.1)
LYMPHS: 41 %
MCH: 31.2 pg (ref 26.6–33.0)
MCHC: 33.7 g/dL (ref 31.5–35.7)
MCV: 92 fL (ref 79–97)
MONOCYTES: 9 %
Monocytes Absolute: 0.4 10*3/uL (ref 0.1–0.9)
NEUTROS ABS: 2.1 10*3/uL (ref 1.4–7.0)
Neutrophils: 40 %
Platelets: 313 10*3/uL (ref 150–379)
RBC: 4.59 x10E6/uL (ref 3.77–5.28)
RDW: 13 % (ref 12.3–15.4)
WBC: 5.2 10*3/uL (ref 3.4–10.8)

## 2017-01-31 LAB — LIPID PANEL
Chol/HDL Ratio: 4.6 ratio — ABNORMAL HIGH (ref 0.0–4.4)
Cholesterol, Total: 329 mg/dL — ABNORMAL HIGH (ref 100–199)
HDL: 71 mg/dL (ref >39)
LDL Calculated: 219 mg/dL — ABNORMAL HIGH (ref 0–99)
Triglycerides: 195 mg/dL — ABNORMAL HIGH (ref 0–149)
VLDL CHOLESTEROL CAL: 39 mg/dL (ref 5–40)

## 2017-01-31 LAB — HEMOGLOBIN A1C
ESTIMATED AVERAGE GLUCOSE: 114 mg/dL
HEMOGLOBIN A1C: 5.6 % (ref 4.8–5.6)

## 2017-01-31 LAB — HIV ANTIBODY (ROUTINE TESTING W REFLEX): HIV Screen 4th Generation wRfx: NONREACTIVE

## 2017-01-31 LAB — HEPATITIS C ANTIBODY: Hep C Virus Ab: <0.1 s/co ratio (ref 0.0–0.9)

## 2017-02-05 DIAGNOSIS — E049 Nontoxic goiter, unspecified: Secondary | ICD-10-CM | POA: Diagnosis not present

## 2017-02-05 DIAGNOSIS — E039 Hypothyroidism, unspecified: Secondary | ICD-10-CM | POA: Diagnosis not present

## 2017-02-13 DIAGNOSIS — R87619 Unspecified abnormal cytological findings in specimens from cervix uteri: Secondary | ICD-10-CM | POA: Insufficient documentation

## 2017-02-18 ENCOUNTER — Encounter: Payer: Self-pay | Admitting: Family Medicine

## 2017-03-02 DIAGNOSIS — Z6829 Body mass index (BMI) 29.0-29.9, adult: Secondary | ICD-10-CM | POA: Diagnosis not present

## 2017-03-02 DIAGNOSIS — Z01419 Encounter for gynecological examination (general) (routine) without abnormal findings: Secondary | ICD-10-CM | POA: Diagnosis not present

## 2017-03-02 DIAGNOSIS — Z1231 Encounter for screening mammogram for malignant neoplasm of breast: Secondary | ICD-10-CM | POA: Diagnosis not present

## 2017-03-02 DIAGNOSIS — M25551 Pain in right hip: Secondary | ICD-10-CM | POA: Diagnosis not present

## 2017-03-03 ENCOUNTER — Encounter: Payer: Self-pay | Admitting: Physician Assistant

## 2017-03-03 ENCOUNTER — Other Ambulatory Visit: Payer: Self-pay

## 2017-03-03 ENCOUNTER — Ambulatory Visit: Payer: BLUE CROSS/BLUE SHIELD | Admitting: Physician Assistant

## 2017-03-03 VITALS — BP 134/80 | HR 70 | Temp 98.3°F | Resp 18 | Ht 66.93 in | Wt 183.6 lb

## 2017-03-03 DIAGNOSIS — R05 Cough: Secondary | ICD-10-CM

## 2017-03-03 DIAGNOSIS — R058 Other specified cough: Secondary | ICD-10-CM

## 2017-03-03 DIAGNOSIS — J3489 Other specified disorders of nose and nasal sinuses: Secondary | ICD-10-CM

## 2017-03-03 MED ORDER — IPRATROPIUM BROMIDE 0.03 % NA SOLN: 2.0000 | Freq: Two times a day (BID) | NASAL | 0 refills | Status: DC

## 2017-03-03 MED ORDER — BENZONATATE 100 MG PO CAPS: 100.0000 mg | ORAL_CAPSULE | Freq: Three times a day (TID) | ORAL | 0 refills | Status: DC | PRN

## 2017-03-03 MED ORDER — ALBUTEROL SULFATE HFA 108 (90 BASE) MCG/ACT IN AERS: 2.0000 | INHALATION_SPRAY | Freq: Four times a day (QID) | RESPIRATORY_TRACT | 0 refills | Status: DC | PRN

## 2017-03-03 NOTE — Patient Instructions (Addendum)
- We will treat this as a respiratory viral infection.  - I recommend you rest, drink plenty of fluids, eat light meals including soups.  - You may use cough syrup at night for your cough and sore throat, Tessalon pearls during the day. Be aware that cough syrup can definitely make you drowsy and sleepy so do not drive or operate any heavy machinery if it is affecting you during the day.  -You may use your albuterol inhaler every 4-6 hours as needed for shortness of breath or wheezing. -You may use Atrovent for nasal congestion and runny nose. - You may also use Mobic for your sore throat.  - Please let me know if you are not seeing any improvement or get worse in 3-5 days.    Cough, Adult Coughing is a reflex that clears your throat and your airways. Coughing helps to heal and protect your lungs. It is normal to cough occasionally, but a cough that happens with other symptoms or lasts a long time may be a sign of a condition that needs treatment. A cough may last only 2-3 weeks (acute), or it may last longer than 8 weeks (chronic). What are the causes? Coughing is commonly caused by:  Breathing in substances that irritate your lungs.  A viral or bacterial respiratory infection.  Allergies.  Asthma.  Postnasal drip.  Smoking.  Acid backing up from the stomach into the esophagus (gastroesophageal reflux).  Certain medicines.  Chronic lung problems, including COPD (or rarely, lung cancer).  Other medical conditions such as heart failure.  Follow these instructions at home: Pay attention to any changes in your symptoms. Take these actions to help with your discomfort:  Take medicines only as told by your health care provider. ? If you were prescribed an antibiotic medicine, take it as told by your health care provider. Do not stop taking the antibiotic even if you start to feel better. ? Talk with your health care provider before you take a cough suppressant medicine.  Drink  enough fluid to keep your urine clear or pale yellow.  If the air is dry, use a cold steam vaporizer or humidifier in your bedroom or your home to help loosen secretions.  Avoid anything that causes you to cough at work or at home.  If your cough is worse at night, try sleeping in a semi-upright position.  Avoid cigarette smoke. If you smoke, quit smoking. If you need help quitting, ask your health care provider.  Avoid caffeine.  Avoid alcohol.  Rest as needed.  Contact a health care provider if:  You have new symptoms.  You cough up pus.  Your cough does not get better after 2-3 weeks, or your cough gets worse.  You cannot control your cough with suppressant medicines and you are losing sleep.  You develop pain that is getting worse or pain that is not controlled with pain medicines.  You have a fever.  You have unexplained weight loss.  You have night sweats. Get help right away if:  You cough up blood.  You have difficulty breathing.  Your heartbeat is very fast. This information is not intended to replace advice given to you by your health care provider. Make sure you discuss any questions you have with your health care provider. Document Released: 09/13/2010 Document Revised: 08/23/2015 Document Reviewed: 05/24/2014 Elsevier Interactive Patient Education  2017 [employer]      IF you received an x-ray today, you will receive an invoice from [facility]. Please contact [facility] at [phone] with questions or concerns regarding your invoice.   IF you received labwork today, you will receive an invoice from [facility]. Please contact [facility] at [phone] with questions or concerns regarding your invoice.   Our billing staff will not be able to assist you with questions regarding bills from these companies.  You will be contacted with the lab results as soon as they are available. The fastest way to get your results is to activate  your My Chart account. Instructions are located on the last page of this paperwork. If you have not heard from us regarding the results in 2 weeks, please contact this office.

## 2017-03-03 NOTE — Progress Notes (Signed)
MRN: [id number] DOB: 1955/07/22  Subjective:   Renee CarinaMARTHA R Simerly is a 61 y.o. female presenting for chief complaint of Cough (x 2-3 days) .  Reports 10 day history of illness. Started out with nasal congestion, ear fullness, and sore throat. This resolved but then it settled in her chest and she has a nagging cough for the past 2-3 days.  Will occasionally have a wheezing sensation after she has coughed a lot.  It is keeping her up at night.  Has tried generic nyquil and dayquil with no full relief of cough. Denies fever, shortness of breath, chest pain and myalgia, night sweats, nausea, vomiting, abdominal pain and diarrhea. Has not had any sick contacts. No history of seasonal allergies, no history of asthma. Patient has had flu shot this season. Denies smoking. Denies any other aggravating or relieving factors, no other questions or concerns.  Arthurine has a current medication list which includes the following prescription(s): cyclobenzaprine, diphenhydramine, levothyroxine, and meloxicam. Also is allergic to codeine.  Dene  has a past medical history of Anemia, Arthritis, History of chicken pox, History of measles, mumps, or rubella, HPV (human papilloma virus) infection, Infertility, female, Joint problem, Shingles, Thyroid disease, Uterine polyp, and Yeast infection. Also  has a past surgical history that includes Dilation and curettage of uterus; laparoscopy; Knee surgery; Bunionectomy; Wisdom tooth extraction; and polyp removal.   Objective:   Vitals: BP 134/80 (BP Location: Left Arm, Patient Position: Sitting, Cuff Size: Normal)   Pulse 70   Temp 98.3 F (36.8 C) (Oral)   Resp 18   Ht 5' 6.93" (1.7 m)   Wt 183 lb 9.6 oz (83.3 kg)   SpO2 98%   BMI 28.82 kg/m   Physical Exam  Constitutional: She is oriented to person, place, and time. She appears well-developed and well-nourished. No distress.  HENT:  Head: Normocephalic and atraumatic.  Right Ear: Tympanic membrane is not  erythematous and not bulging. A middle ear effusion is present.  Left Ear: Tympanic membrane, external ear and ear canal normal.  Nose: Rhinorrhea present. Right sinus exhibits no maxillary sinus tenderness and no frontal sinus tenderness. Left sinus exhibits no maxillary sinus tenderness and no frontal sinus tenderness.  Mouth/Throat: Uvula is midline, oropharynx is clear and moist and mucous membranes are normal. No tonsillar exudate.  Eyes: Conjunctivae are normal.  Neck: Normal range of motion.  Cardiovascular: Normal rate, regular rhythm and normal heart sounds.  Pulmonary/Chest: Effort normal. No respiratory distress. She has no decreased breath sounds. She has no wheezes. She has no rhonchi. She has no rales.  Lymphadenopathy:       Head (right side): No submental, no submandibular, no tonsillar, no preauricular, no posterior auricular and no occipital adenopathy present.       Head (left side): No submental, no submandibular, no tonsillar, no preauricular, no posterior auricular and no occipital adenopathy present.    She has no cervical adenopathy.       Right: No supraclavicular adenopathy present.       Left: No supraclavicular adenopathy present.  Neurological: She is alert and oriented to person, place, and time.  Skin: Skin is warm and dry.  Psychiatric: She has a normal mood and affect.  Vitals reviewed.  No results found for this or any previous visit (from the past 24 hour(s)).  Assessment and Plan :  1. Post-viral cough syndrome Patient is well-appearing.  Vitals are stable.  She is afebrile.  Lungs are CTAB.  Patient reassured  that her history is consistent with post viral cough.  Will treat symptomatically at this time.  Patient is allergic to codeine and therefore cannot use cough syrup with codeine.  I have given her alternative options for over-the-counter cough syrups that will help with sleeping.  Encouraged to return to clinic if symptoms worsen, do not improve, or as  needed. - benzonatate (TESSALON) 100 MG capsule; Take 1-2 capsules (100-200 mg total) by mouth 3 (three) times daily as needed for cough.  Dispense: 40 capsule; Refill: 0 - albuterol (PROVENTIL HFA;VENTOLIN HFA) 108 (90 Base) MCG/ACT inhaler; Inhale 2 puffs into the lungs every 6 (six) hours as needed for wheezing or shortness of breath.  Dispense: 1 Inhaler; Refill: 0  2. Rhinorrhea - ipratropium (ATROVENT) 0.03 % nasal spray; Place 2 sprays into both nostrils 2 (two) times daily.  Dispense: 30 mL; Refill: 0  Benjiman CoreBrittany Deondray Ospina, PA-C  Primary Care at [facility] 03/04/2017 11:32 AM

## 2017-03-04 ENCOUNTER — Encounter: Payer: Self-pay | Admitting: Physician Assistant

## 2017-03-11 ENCOUNTER — Other Ambulatory Visit: Payer: Self-pay | Admitting: Physician Assistant

## 2017-03-11 DIAGNOSIS — R05 Cough: Secondary | ICD-10-CM

## 2017-03-11 DIAGNOSIS — R058 Other specified cough: Secondary | ICD-10-CM

## 2017-03-11 NOTE — Telephone Encounter (Signed)
Please advise refill? 

## 2017-03-20 ENCOUNTER — Ambulatory Visit: Payer: BLUE CROSS/BLUE SHIELD | Admitting: Physician Assistant

## 2017-03-20 ENCOUNTER — Encounter: Payer: Self-pay | Admitting: Physician Assistant

## 2017-03-20 ENCOUNTER — Other Ambulatory Visit: Payer: Self-pay

## 2017-03-20 ENCOUNTER — Ambulatory Visit (INDEPENDENT_AMBULATORY_CARE_PROVIDER_SITE_OTHER): Payer: BLUE CROSS/BLUE SHIELD

## 2017-03-20 VITALS — BP 145/82 | HR 89 | Temp 98.3°F | Resp 16 | Ht 66.75 in | Wt 183.4 lb

## 2017-03-20 DIAGNOSIS — J209 Acute bronchitis, unspecified: Secondary | ICD-10-CM | POA: Diagnosis not present

## 2017-03-20 DIAGNOSIS — J029 Acute pharyngitis, unspecified: Secondary | ICD-10-CM

## 2017-03-20 DIAGNOSIS — R05 Cough: Secondary | ICD-10-CM | POA: Diagnosis not present

## 2017-03-20 DIAGNOSIS — H6691 Otitis media, unspecified, right ear: Secondary | ICD-10-CM

## 2017-03-20 DIAGNOSIS — R059 Cough, unspecified: Secondary | ICD-10-CM

## 2017-03-20 LAB — POCT RAPID STREP A (OFFICE): Rapid Strep A Screen: NEGATIVE

## 2017-03-20 MED ORDER — AZITHROMYCIN 250 MG PO TABS: ORAL_TABLET | ORAL | 0 refills | Status: DC

## 2017-03-20 MED ORDER — BENZONATATE 200 MG PO CAPS: 200.0000 mg | ORAL_CAPSULE | Freq: Three times a day (TID) | ORAL | 0 refills | Status: DC | PRN

## 2017-03-20 NOTE — Progress Notes (Signed)
Windy CarinaMARTHA R Trinidad  MRN: [id number] DOB: 1955-04-20  PCP: Ethelda ChickSmith, Kristi M, MD  Subjective:  Pt is a 61 year old female who presents to clinic for cough x 3 weeks. Worse at night and in the morning. Endorses fatigue. She was here 12/4 for the same c/c and treated supportively with albuterol, tessalon and ipratropium nasal spray. She is not better. Cough has persisted.  Denies fever, chills, chest pressure, sinus pressure, ear drainage, palpitations, night sweats.     Review of Systems  Constitutional: Positive for fatigue. Negative for chills, diaphoresis and fever.  HENT: Negative for congestion, postnasal drip, rhinorrhea, sinus pressure, sinus pain and sore throat.   Respiratory: Positive for cough, shortness of breath and wheezing.   Psychiatric/Behavioral: Positive for sleep disturbance.    Patient Active Problem List   Diagnosis Date Noted  . Abnormal cervical Papanicolaou smear 02/13/2017  . Blood in urine 02/28/2016  . Degenerative disc disease, cervical 01/26/2014  . Arthritis, senescent 01/26/2014  . Menopause 01/16/2012  . Goiter 01/16/2012    Current Outpatient Medications on File Prior to Visit  Medication Sig Dispense Refill  . albuterol (PROVENTIL HFA;VENTOLIN HFA) 108 (90 Base) MCG/ACT inhaler Inhale 2 puffs into the lungs every 6 (six) hours as needed for wheezing or shortness of breath. 1 Inhaler 0  . benzonatate (TESSALON) 100 MG capsule TAKE 1-2 CAPSULES (100-200 MG TOTAL) BY MOUTH 3 (THREE) TIMES DAILY AS NEEDED FOR COUGH. 40 capsule 0  . cyclobenzaprine (FLEXERIL) 10 MG tablet Take 1 tablet (10 mg total) by mouth 3 (three) times daily as needed for muscle spasms. 90 tablet 1  . diphenhydrAMINE (BENADRYL) 25 MG tablet Take 25 mg by mouth every 6 (six) hours as needed.    Marland Kitchen. levothyroxine (SYNTHROID, LEVOTHROID) 50 MCG tablet Take 50 mcg by mouth daily before breakfast.    . meloxicam (MOBIC) 15 MG tablet Take 1 tablet (15 mg total) by mouth daily. 90 tablet 0    . predniSONE (DELTASONE) 20 MG tablet Take 20 mg by mouth daily with breakfast.    . ipratropium (ATROVENT) 0.03 % nasal spray Place 2 sprays into both nostrils 2 (two) times daily. (Patient not taking: Reported on 03/20/2017) 30 mL 0   No current facility-administered medications on file prior to visit.     Allergies  Allergen Reactions  . Codeine Nausea And Vomiting     Objective:  BP (!) 145/82   Pulse 89   Temp 98.3 F (36.8 C) (Oral)   Resp 16   Ht 5' 6.75" (1.695 m)   Wt 183 lb 6.4 oz (83.2 kg)   SpO2 95%   BMI 28.94 kg/m   Physical Exam  Constitutional: She is oriented to person, place, and time and well-developed, well-nourished, and in no distress. No distress.  HENT:  Right Ear: Tympanic membrane is injected and bulging. A middle ear effusion is present.  Left Ear: Tympanic membrane is bulging.  Nose: Mucosal edema present. No rhinorrhea. Right sinus exhibits no maxillary sinus tenderness and no frontal sinus tenderness. Left sinus exhibits no maxillary sinus tenderness and no frontal sinus tenderness.  Mouth/Throat: Oropharynx is clear and moist and mucous membranes are normal.  Cardiovascular: Normal rate, regular rhythm and normal heart sounds.  Pulmonary/Chest: Effort normal and breath sounds normal. No respiratory distress. She has no wheezes. She has no rales.  Neurological: She is alert and oriented to person, place, and time. GCS score is 15.  Skin: Skin is warm and dry.  Psychiatric:  Mood, memory, affect and judgment normal.  Vitals reviewed.   Results for orders placed or performed in visit on 03/20/17  POCT rapid strep A  Result Value Ref Range   Rapid Strep A Screen Negative Negative   Dg Chest 2 View  Result Date: 03/20/2017 CLINICAL DATA:  Cough for the past month. EXAM: CHEST  2 VIEW COMPARISON:  None. FINDINGS: Mildly enlarged cardiac silhouette. Clear lungs. Mild diffuse peribronchial thickening. Upper lumbar spine degenerative changes. Mild  scoliosis. IMPRESSION: Mild bronchitic changes and mild cardiomegaly. Electronically Signed   By: Beckie SaltsSteven  Reid M.D.   On: 03/20/2017 17:00    Assessment and Plan :  1. Acute bronchitis, unspecified organism 2. Right otitis media, unspecified otitis media type - azithromycin (ZITHROMAX) 250 MG tablet; Take 2 tabs PO x 1 dose, then 1 tab PO QD x 4 days  Dispense: 6 tablet; Refill: 0 - pt c/o cough x 3 weeks. Vitals are stable. chest x-ray is negative. Plan to treat for otitis media. RTC PRN.  3. Cough - DG Chest 2 View; Future - benzonatate (TESSALON) 200 MG capsule; Take 1 capsule (200 mg total) by mouth 3 (three) times daily as needed for cough.  Dispense: 30 capsule; Refill: 0  4. Sore throat - POCT rapid strep A - Culture, Group A Strep   Marco CollieWhitney Brenyn Petrey, PA-C  Primary Care at [facility] 03/20/2017 4:36 PM

## 2017-03-20 NOTE — Patient Instructions (Addendum)
Stay well hydrated. Get lost of rest. Come back if you are not better in 5-7 days.   Advil or ibuprofen for pain. Do not take Aspirin.  Cepacol - Throat lozenges (if you are not at risk for choking) or sprays may be used to soothe your throat. Drink enough water and fluids to keep your urine clear or pale yellow. For sore throat: ? Gargle with 8 oz of salt water ( tsp of salt per 1 qt of water) as often as every 1-2 hours to soothe your throat.  Gargle liquid benadryl.  Use Elderberry syrup.   For sore throat try using a honey-based tea. Use 3 teaspoons of honey with juice squeezed from half lemon. Place shaved pieces of ginger into 1/2-1 cup of water and warm over stove top. Then mix the ingredients and repeat every 4 hours as needed.  Cough Syrup Recipe: Sweet Lemon & Honey Thyme  Ingredients a handful of fresh thyme sprigs   1 pint of water (2 cups)  1/2 cup honey (raw is best, but regular will do)  1/2 lemon chopped Instructions 1. Place the lemon in the pint jar and cover with the honey. The honey will macerate the lemons and draw out liquids which taste so delicious! 2. Meanwhile, toss the thyme leaves into a saucepan and cover them with the water. 3. Bring the water to a gentle simmer and reduce it to half, about a cup of tea. 4. When the tea is reduced and cooled a bit, strain the sprigs & leaves, add it into the pint jar and stir it well. 5. Give it a shake and use a spoonful as needed. 6. Store your homemade cough syrup in the refrigerator for about a month.  What causes a cough? In adults, common causes of a cough include: ?An infection of the airways or lungs (such as the common cold) ?Postnasal drip - Postnasal drip is when mucus from the nose drips down or flows along the back of the throat. Postnasal drip can happen when people have: .A cold .Allergies .A sinus infection - The sinuses are hollow areas in the bones of the face that open into the nose. ?Lung  conditions, like asthma and chronic obstructive pulmonary disease (COPD) - Both of these conditions can make it hard to breathe. COPD is usually caused by smoking. ?Acid reflux - Acid reflux is when the acid that is normally in your stomach backs up into your esophagus (the tube that carries food from your mouth to your stomach). ?A side effect from blood pressure medicines called "ACE inhibitors" ?Smoking cigarettes  Is there anything I can do on my own to get rid of my cough? Yes. To help get rid of your cough, you can: ?Use a humidifier in your bedroom ?Use an over-the-counter cough medicine, or suck on cough drops or hard candy ?Stop smoking, if you smoke ?If you have allergies, avoid the things you are allergic to (like pollen, dust, animals, or mold) If you have acid reflux, your doctor or nurse will tell you which lifestyle changes can help reduce symptoms.     IF you received an x-ray today, you will receive an invoice from [facility]. Please contact [facility] at [phone] with questions or concerns regarding your invoice.   IF you received labwork today, you will receive an invoice from [facility]. Please contact [facility] at [phone] with questions or concerns regarding your invoice.   Our billing staff will not be able to assist you  with questions regarding bills from these companies.  You will be contacted with the lab results as soon as they are available. The fastest way to get your results is to activate your My Chart account. Instructions are located on the last page of this paperwork. If you have not heard from [place] regarding the results in 2 weeks, please contact this office.

## 2017-03-26 LAB — CULTURE, GROUP A STREP

## 2017-03-30 ENCOUNTER — Other Ambulatory Visit: Payer: Self-pay | Admitting: Physician Assistant

## 2017-03-30 DIAGNOSIS — J3489 Other specified disorders of nose and nasal sinuses: Secondary | ICD-10-CM

## 2017-04-08 DIAGNOSIS — R928 Other abnormal and inconclusive findings on diagnostic imaging of breast: Secondary | ICD-10-CM | POA: Diagnosis not present

## 2017-04-21 DIAGNOSIS — Z1211 Encounter for screening for malignant neoplasm of colon: Secondary | ICD-10-CM | POA: Diagnosis not present

## 2017-05-06 LAB — COLOGUARD: Cologuard: NEGATIVE

## 2017-05-06 NOTE — Addendum Note (Signed)
Addended by: Baldwin CrownJOHNSON, SHAQUETTA D on: 05/06/2017 11:32 AM   Modules accepted: Orders

## 2017-08-25 ENCOUNTER — Encounter: Payer: Self-pay | Admitting: Family Medicine

## 2017-11-27 ENCOUNTER — Ambulatory Visit (INDEPENDENT_AMBULATORY_CARE_PROVIDER_SITE_OTHER): Payer: BLUE CROSS/BLUE SHIELD | Admitting: Physician Assistant

## 2017-11-27 ENCOUNTER — Encounter: Payer: Self-pay | Admitting: Physician Assistant

## 2017-11-27 ENCOUNTER — Other Ambulatory Visit: Payer: Self-pay

## 2017-11-27 VITALS — BP 126/82 | HR 72 | Temp 98.4°F | Resp 16 | Ht 66.0 in | Wt 176.2 lb

## 2017-11-27 DIAGNOSIS — Z13228 Encounter for screening for other metabolic disorders: Secondary | ICD-10-CM | POA: Diagnosis not present

## 2017-11-27 DIAGNOSIS — Z1321 Encounter for screening for nutritional disorder: Secondary | ICD-10-CM | POA: Diagnosis not present

## 2017-11-27 DIAGNOSIS — Z1329 Encounter for screening for other suspected endocrine disorder: Secondary | ICD-10-CM | POA: Diagnosis not present

## 2017-11-27 DIAGNOSIS — Z23 Encounter for immunization: Secondary | ICD-10-CM | POA: Diagnosis not present

## 2017-11-27 DIAGNOSIS — Z0000 Encounter for general adult medical examination without abnormal findings: Secondary | ICD-10-CM | POA: Diagnosis not present

## 2017-11-27 DIAGNOSIS — Z130 Encounter for screening for diseases of the blood and blood-forming organs and certain disorders involving the immune mechanism: Secondary | ICD-10-CM

## 2017-11-27 LAB — POCT URINALYSIS DIP (MANUAL ENTRY)
Bilirubin, UA: NEGATIVE
Glucose, UA: NEGATIVE mg/dL
Ketones, POC UA: NEGATIVE mg/dL
Leukocytes, UA: NEGATIVE
Nitrite, UA: NEGATIVE
Protein Ur, POC: NEGATIVE mg/dL
Spec Grav, UA: 1.02 (ref 1.010–1.025)
Urobilinogen, UA: 0.2 E.U./dL
pH, UA: 6 (ref 5.0–8.0)

## 2017-11-27 NOTE — Patient Instructions (Addendum)
Thank you for allowing me to take part in caring for your health.   Wear compression socks during your work day.  Get up and sneak in movement (ie, take the extra long way to the bathroom) during your work day. Elevation of your feet (above your hips) at the end of the day will help  NUTRITION Eat more plants: colorful vegetables, nuts, seeds and berries. Eat less sugar, salt, preservatives and processed foods. Avoid toxins such as cigarettes and alcohol. Drink water when you are thirsty. Warm water with a slice of lemon is an excellent morning drink to start the day.  Consider these websites for more information The Nutrition Source (https://www.henry-hernandez.biz/) Precision Nutrition (WindowBlog.ch)  RELAXATION Consider practicing mindfulness meditation or other relaxation techniques such as deep breathing, prayer, yoga, tai chi, massage. See website mindful.org or the apps Headspace or Calm to help get started.  SLEEP Try to get at least 7-8+ hours sleep per day. Regular exercise and reduced caffeine will help you sleep better. Practice good sleep hygeine techniques. See website sleep.org for more information.   PLANNING Prepare estate planning, living will, healthcare POA documents. Sometimes this is best planned with the help of an attorney. Theconversationproject.org and agingwithdignity.org are excellent resources.   If you have lab work done today you will be contacted with your lab results within the next 2 weeks.  If you have not heard from [place] then please contact us. The fastest way to get your results is to register for My Chart.  Thank you for coming in today. I hope you feel we met your needs.  Feel free to call PCP if you have any questions or further requests.  Please consider signing up for MyChart if you do not already have it, as this is a great way to communicate with me.  Best,  Whitney McVey, PA-C  IF you  received an x-ray today, you will receive an invoice from [facility]. Please contact [facility] at [phone] with questions or concerns regarding your invoice.   IF you received labwork today, you will receive an invoice from [place]. Please contact [facility] at [phone] with questions or concerns regarding your invoice.   Our billing staff will not be able to assist you with questions regarding bills from these companies.  You will be contacted with the lab results as soon as they are available. The fastest way to get your results is to activate your My Chart account. Instructions are located on the last page of this paperwork. If you have not heard from [place] regarding the results in 2 weeks, please contact this office.

## 2017-11-27 NOTE — Progress Notes (Signed)
Primary Care at [facility] [address] [place] [address] [phone]  Date:  11/27/2017   Name:  Renee Jordan   DOB:  [birth year]   MRN:  [id number]  PCP:  Sebastian Ache, PA-C    Chief Complaint: Annual Exam (pt will have GYN to fax a copy of pap results, (overdue health maint discussed w/pt))   History of Present Illness:  This is a pleasant 62 y.o. female who  has a past medical history of Anemia, Arthritis, History of chicken pox, History of measles, mumps, or rubella, HPV (human papilloma virus) infection, Infertility, female, Joint problem, Shingles, Thyroid disease, Uterine polyp, and Yeast infection. who is presenting for CPE. 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   Hypothyroidism: Ballen yearly; was getting [place] q 6 months, but no growth in a few years so this is stopping.   DDD cervical: followed by Maurice Small. taking Meloxicam 1-3x/week; not taking Tylenol.   Complaints: she would like kidney testing today bc she takes meloxicam weekly.    Last pap: Pap smear: fall of 2018; Lowell Guitar at [facility] Sexual history: active with husband of 25 years only.  Immunizations: utd Dentist: q 6 months Eye: 20/15 b/l  Diet/Exercise: eating healthy "I stopped going to [employer]". Exercises often.   Tobacco/alcohol/substance use: never smoker, no drinking, no drug use.   Mammogram: 2019 Colonoscopy: cologard 05/06/2017 - negative Bone density:  2006  BP Readings from Last 3 Encounters:  11/27/17 126/82  03/20/17 (!) 145/82  03/03/17 134/80   Wt Readings from Last 3 Encounters:  11/27/17 176 lb 3.2 oz (79.9 kg)  03/20/17 183 lb 6.4 oz (83.2 kg)  03/03/17 183 lb 9.6 oz (83.3 kg)    Review of Systems:  Review of Systems  Constitutional: Negative for chills, diaphoresis, fatigue and fever.  HENT: Negative for congestion, postnasal drip, rhinorrhea, sinus pressure, sneezing and sore throat.   Respiratory: Negative for cough, chest  tightness, shortness of breath and wheezing.   Cardiovascular: Negative for chest pain and palpitations.  Gastrointestinal: Negative for abdominal pain, diarrhea, nausea and vomiting.  Genitourinary: Negative for decreased urine volume, difficulty urinating, dysuria, enuresis, flank pain, frequency, hematuria and urgency.  Musculoskeletal: Negative for back pain.  Neurological: Negative for dizziness, weakness, light-headedness and headaches.    Patient Active Problem List   Diagnosis Date Noted  . Abnormal cervical Papanicolaou smear 02/13/2017  . Blood in urine 02/28/2016  . Degenerative disc disease, cervical 01/26/2014  . Arthritis, senescent 01/26/2014  . Menopause 01/16/2012  . Goiter 01/16/2012    Prior to Admission medications   Medication Sig Start Date End Date Taking? Authorizing Provider  albuterol (PROVENTIL HFA;VENTOLIN HFA) 108 (90 Base) MCG/ACT inhaler Inhale 2 puffs into the lungs every 6 (six) hours as needed for wheezing or shortness of breath. 03/03/17  Yes Barnett Abu, [place] D, PA-C  cyclobenzaprine (FLEXERIL) 10 MG tablet Take 1 tablet (10 mg total) by mouth 3 (three) times daily as needed for muscle spasms. 01/30/17  Yes Ethelda Chick, MD  diphenhydrAMINE (BENADRYL) 25 MG tablet Take 25 mg by mouth every 6 (six) hours as needed.   Yes [provider]  levothyroxine (SYNTHROID, LEVOTHROID) 50 MCG tablet Take 50 mcg by mouth daily before breakfast.   Yes [provider]  ipratropium (ATROVENT) 0.03 % nasal spray Place 2 sprays into both nostrils 2 (two) times daily. Patient not taking: Reported on 03/20/2017 03/03/17   Magdalene River, PA-C  meloxicam (MOBIC) 15 MG  tablet Take 1 tablet (15 mg total) by mouth daily. 01/30/17   Ethelda ChickSmith, Kristi M, MD    Allergies  Allergen Reactions  . Codeine Nausea And Vomiting    Past Surgical History:  Procedure Laterality Date  . BUNIONECTOMY    . DILATION AND CURETTAGE OF UTERUS    . KNEE SURGERY    .  LAPAROSCOPY    . polyp removal    . WISDOM TOOTH EXTRACTION      Social History   Tobacco Use  . Smoking status: Never Smoker  . Smokeless tobacco: Never Used  Substance Use Topics  . Alcohol use: No  . Drug use: No    Family History  Problem Relation Age of Onset  . Heart disease Brother   . Diabetes Brother   . Hyperlipidemia Brother   . Hypertension Brother   . Hearing loss Brother 59       CAD/PTCA  . Asthma Sister   . Emphysema Mother   . Cancer Father [id number]       lung cancer/prostate cancer  . Hypertension Brother   . Stroke Paternal Grandfather   . Stroke Maternal Grandmother   . Heart disease Maternal Grandfather   . Stroke Maternal Grandfather     Medication list has been reviewed and updated.  Physical Examination:  Physical Exam  Constitutional: She is oriented to person, place, and time. She appears well-developed and well-nourished. No distress.  HENT:  Head: Normocephalic and atraumatic.  Mouth/Throat: Oropharynx is clear and moist.  Eyes: Pupils are equal, round, and reactive to light. Conjunctivae and EOM are normal.  Neck: Normal range of motion. Neck supple. Thyromegaly present.  Cardiovascular: Normal rate, regular rhythm and normal heart sounds.  No murmur heard. Pulmonary/Chest: Effort normal and breath sounds normal. She has no wheezes.  Abdominal: Soft. There is no tenderness.  Musculoskeletal: Normal range of motion.  Neurological: She is alert and oriented to person, place, and time. She has normal reflexes.  Skin: Skin is warm and dry.  Psychiatric: She has a normal mood and affect. Her behavior is normal. Judgment and thought content normal.  Vitals reviewed.   BP 126/82 (BP Location: Left Arm, Patient Position: Sitting, Cuff Size: Normal)   Pulse 72   Temp 98.4 F (36.9 C) (Oral)   Resp 16   Ht 5\' 6"  (1.676 m)   Wt 176 lb 3.2 oz (79.9 kg)   SpO2 96%   BMI 28.44 kg/m  Results for orders placed or performed in visit on 11/27/17   POCT urinalysis dipstick  Result Value Ref Range   Color, UA yellow yellow   Clarity, UA clear clear   Glucose, UA negative negative mg/dL   Bilirubin, UA negative negative   Ketones, POC UA negative negative mg/dL   Spec Grav, UA [id number] [id number] - 1.025   Blood, UA moderate (A) negative   pH, UA 6.0 5.0 - 8.0   Protein Ur, POC negative negative mg/dL   Urobilinogen, UA 0.2 0.2 or 1.0 E.U./dL   Nitrite, UA Negative Negative   Leukocytes, UA Negative Negative    Assessment and Plan: 1.  Annual physical exam - pt presents for annual exam. Feeling well today. Thyroid checked by Ballen. DDD cervical managed by Ibazebo.  [facility]ell [facility]. MM 2019, Cologard done in 05/06/2017 - negative. Routine labs are pending, will contact with results. Anticipatory guidance discussed. RTC in 1 year for annual exam.   2.  Flu  vaccine need - Administered by CMA.  - Flu Vaccine QUAD 36+ mos IM  3. Screening for endocrine, metabolic and immunity disorder - CBC with Differential/Platelet - COMPLETE METABOLIC PANEL WITH GFR - POCT urinalysis dipstick  4. Encounter for vitamin deficiency screening - VITAMIN D 25 Hydroxy (Vit-D Deficiency, Fractures)  Marco Collie, PA-C  Primary Care at [facility] 11/27/2017 8:17 AM

## 2017-11-28 LAB — CBC WITH DIFFERENTIAL/PLATELET
Basophils Absolute: 0.1 10*3/uL (ref 0.0–0.2)
Basos: 1 %
EOS (ABSOLUTE): 0.4 10*3/uL (ref 0.0–0.4)
Eos: 7 %
Hematocrit: 42.5 % (ref 34.0–46.6)
Hemoglobin: 14 g/dL (ref 11.1–15.9)
Immature Grans (Abs): 0 10*3/uL (ref 0.0–0.1)
Immature Granulocytes: 0 %
Lymphocytes Absolute: 2.8 10*3/uL (ref 0.7–3.1)
Lymphs: 46 %
MCH: 30 pg (ref 26.6–33.0)
MCHC: 32.9 g/dL (ref 31.5–35.7)
MCV: 91 fL (ref 79–97)
Monocytes Absolute: 0.7 10*3/uL (ref 0.1–0.9)
Monocytes: 12 %
Neutrophils Absolute: 2 10*3/uL (ref 1.4–7.0)
Neutrophils: 34 %
Platelets: 286 10*3/uL (ref 150–450)
RBC: 4.66 x10E6/uL (ref 3.77–5.28)
RDW: 12.8 % (ref 12.3–15.4)
WBC: 5.9 10*3/uL (ref 3.4–10.8)

## 2017-11-28 LAB — VITAMIN D 25 HYDROXY (VIT D DEFICIENCY, FRACTURES): Vit D, 25-Hydroxy: 22.2 ng/mL — ABNORMAL LOW (ref 30.0–100.0)

## 2017-12-10 ENCOUNTER — Encounter: Payer: Self-pay | Admitting: Physician Assistant

## 2017-12-10 NOTE — Progress Notes (Signed)
Start vit D 800 IU/day and proper calcium intake. Results released to mychart.

## 2017-12-16 ENCOUNTER — Other Ambulatory Visit: Payer: Self-pay | Admitting: Physician Assistant

## 2017-12-16 ENCOUNTER — Telehealth: Payer: Self-pay | Admitting: Physician Assistant

## 2017-12-16 DIAGNOSIS — Z1329 Encounter for screening for other suspected endocrine disorder: Secondary | ICD-10-CM

## 2017-12-16 NOTE — Telephone Encounter (Signed)
Left a voicemail for the patient to call and schedule a LAB ONLY Visit. Mcvey ordered additional labs. SHay 12-16-17

## 2017-12-22 ENCOUNTER — Ambulatory Visit (INDEPENDENT_AMBULATORY_CARE_PROVIDER_SITE_OTHER): Payer: BLUE CROSS/BLUE SHIELD | Admitting: Physician Assistant

## 2017-12-22 DIAGNOSIS — Z1329 Encounter for screening for other suspected endocrine disorder: Secondary | ICD-10-CM

## 2017-12-22 LAB — CMP14+EGFR
ALT: 16 IU/L (ref 0–32)
AST: 17 IU/L (ref 0–40)
Albumin/Globulin Ratio: 2 (ref 1.2–2.2)
Albumin: 4.4 g/dL (ref 3.6–4.8)
Alkaline Phosphatase: 69 IU/L (ref 39–117)
BUN/Creatinine Ratio: 13 (ref 12–28)
BUN: 12 mg/dL (ref 8–27)
Bilirubin Total: 0.4 mg/dL (ref 0.0–1.2)
CO2: 25 mmol/L (ref 20–29)
Calcium: 9.5 mg/dL (ref 8.7–10.3)
Chloride: 103 mmol/L (ref 96–106)
Creatinine, Ser: 0.91 mg/dL (ref 0.57–1.00)
GFR calc Af Amer: 78 mL/min/{1.73_m2} (ref >59)
GFR calc non Af Amer: 67 mL/min/{1.73_m2} (ref >59)
Globulin, Total: 2.2 g/dL (ref 1.5–4.5)
Glucose: 94 mg/dL (ref 65–99)
Potassium: 4.4 mmol/L (ref 3.5–5.2)
Sodium: 141 mmol/L (ref 134–144)
Total Protein: 6.6 g/dL (ref 6.0–8.5)

## 2017-12-22 NOTE — Progress Notes (Signed)
Lab only visit. I did not see this pt.  

## 2017-12-30 ENCOUNTER — Other Ambulatory Visit: Payer: Self-pay | Admitting: Endocrinology

## 2017-12-30 DIAGNOSIS — E049 Nontoxic goiter, unspecified: Secondary | ICD-10-CM

## 2018-01-13 ENCOUNTER — Other Ambulatory Visit: Payer: BLUE CROSS/BLUE SHIELD

## 2018-01-13 ENCOUNTER — Ambulatory Visit
Admission: RE | Admit: 2018-01-13 | Discharge: 2018-01-13 | Disposition: A | Discharge status: Home / Self Care | Payer: BLUE CROSS/BLUE SHIELD | Source: Ambulatory Visit | Attending: Endocrinology | Admitting: Endocrinology

## 2018-01-13 DIAGNOSIS — E042 Nontoxic multinodular goiter: Secondary | ICD-10-CM | POA: Diagnosis not present

## 2018-01-13 DIAGNOSIS — E049 Nontoxic goiter, unspecified: Secondary | ICD-10-CM

## 2018-01-29 DIAGNOSIS — E049 Nontoxic goiter, unspecified: Secondary | ICD-10-CM | POA: Diagnosis not present

## 2018-02-05 DIAGNOSIS — E049 Nontoxic goiter, unspecified: Secondary | ICD-10-CM | POA: Diagnosis not present

## 2018-02-05 DIAGNOSIS — E039 Hypothyroidism, unspecified: Secondary | ICD-10-CM | POA: Diagnosis not present

## 2018-02-09 ENCOUNTER — Other Ambulatory Visit: Payer: Self-pay | Admitting: Endocrinology

## 2018-02-09 DIAGNOSIS — E039 Hypothyroidism, unspecified: Secondary | ICD-10-CM

## 2018-02-15 DIAGNOSIS — R51 Headache: Secondary | ICD-10-CM | POA: Diagnosis not present

## 2018-02-15 DIAGNOSIS — M47812 Spondylosis without myelopathy or radiculopathy, cervical region: Secondary | ICD-10-CM | POA: Diagnosis not present

## 2018-02-15 DIAGNOSIS — M542 Cervicalgia: Secondary | ICD-10-CM | POA: Diagnosis not present

## 2018-03-03 DIAGNOSIS — Z01419 Encounter for gynecological examination (general) (routine) without abnormal findings: Secondary | ICD-10-CM | POA: Diagnosis not present

## 2018-03-03 DIAGNOSIS — Z6828 Body mass index (BMI) 28.0-28.9, adult: Secondary | ICD-10-CM | POA: Diagnosis not present

## 2018-03-03 DIAGNOSIS — Z1231 Encounter for screening mammogram for malignant neoplasm of breast: Secondary | ICD-10-CM | POA: Diagnosis not present

## 2018-03-03 DIAGNOSIS — E049 Nontoxic goiter, unspecified: Secondary | ICD-10-CM | POA: Diagnosis not present

## 2018-03-09 ENCOUNTER — Ambulatory Visit
Admission: RE | Admit: 2018-03-09 | Discharge: 2018-03-09 | Disposition: A | Discharge status: Home / Self Care | Payer: BLUE CROSS/BLUE SHIELD | Source: Ambulatory Visit | Attending: Endocrinology | Admitting: Endocrinology

## 2018-03-09 ENCOUNTER — Other Ambulatory Visit (HOSPITAL_COMMUNITY)
Admission: RE | Admit: 2018-03-09 | Discharge: 2018-03-09 | Disposition: A | Discharge status: Home / Self Care | Payer: BLUE CROSS/BLUE SHIELD | Source: Ambulatory Visit | Attending: Interventional Radiology | Admitting: Interventional Radiology

## 2018-03-09 DIAGNOSIS — E039 Hypothyroidism, unspecified: Secondary | ICD-10-CM | POA: Insufficient documentation

## 2018-03-09 DIAGNOSIS — E041 Nontoxic single thyroid nodule: Secondary | ICD-10-CM | POA: Diagnosis not present

## 2018-03-12 NOTE — Progress Notes (Signed)
Date of last mammogram: 02/23/2006. (Notes: patient chooses not to have unless a family member is diagnosed with cancer.) Date of Last colonoscopy: 03/31/2017(Notes: Cologard 03-2017/normal per pt). Date of last bone density: 02/12/2005 (Notes: T Score 1.6) Date of Last pap Smear: 02/28/2016. HPV Test: Negative.

## 2019-02-15 ENCOUNTER — Other Ambulatory Visit: Payer: Self-pay | Admitting: Endocrinology

## 2019-02-15 DIAGNOSIS — E049 Nontoxic goiter, unspecified: Secondary | ICD-10-CM

## 2019-02-23 ENCOUNTER — Other Ambulatory Visit: Payer: BLUE CROSS/BLUE SHIELD

## 2019-03-02 ENCOUNTER — Ambulatory Visit
Admission: RE | Admit: 2019-03-02 | Discharge: 2019-03-02 | Disposition: A | Discharge status: Home / Self Care | Payer: Medicare Other | Source: Ambulatory Visit | Attending: Endocrinology | Admitting: Endocrinology

## 2019-03-02 DIAGNOSIS — E049 Nontoxic goiter, unspecified: Secondary | ICD-10-CM

## 2019-03-09 ENCOUNTER — Encounter (INDEPENDENT_AMBULATORY_CARE_PROVIDER_SITE_OTHER): Payer: Self-pay | Admitting: Physician Assistant

## 2019-03-21 ENCOUNTER — Ambulatory Visit: Payer: Medicare Other | Attending: Internal Medicine

## 2019-03-21 DIAGNOSIS — Z20822 Contact with and (suspected) exposure to covid-19: Secondary | ICD-10-CM

## 2019-03-22 LAB — NOVEL CORONAVIRUS, NAA: SARS-CoV-2, NAA: NOT DETECTED

## 2019-04-29 ENCOUNTER — Ambulatory Visit: Payer: Medicare Other

## 2019-05-03 ENCOUNTER — Ambulatory Visit: Payer: Medicare Other | Attending: Internal Medicine

## 2019-05-03 ENCOUNTER — Encounter (INDEPENDENT_AMBULATORY_CARE_PROVIDER_SITE_OTHER): Payer: Self-pay | Admitting: Physician Assistant

## 2019-05-03 DIAGNOSIS — Z20822 Contact with and (suspected) exposure to covid-19: Secondary | ICD-10-CM

## 2019-05-04 LAB — NOVEL CORONAVIRUS, NAA: SARS-CoV-2, NAA: NOT DETECTED

## 2019-05-09 ENCOUNTER — Ambulatory Visit: Payer: Medicare Other | Attending: Internal Medicine

## 2019-05-09 DIAGNOSIS — Z23 Encounter for immunization: Secondary | ICD-10-CM

## 2019-05-09 NOTE — Progress Notes (Signed)
   Covid-19 Vaccination Clinic  Name:  DARREL BARONI    MRN: [id number] DOB: 15-Dec-1955  05/09/2019  Ms. Redmon was observed post Covid-19 immunization for 15 minutes without incidence. She was provided with Vaccine Information Sheet and instruction to access the V-Safe system.   Ms. Goya was instructed to call 911 with any severe reactions post vaccine: Marland Kitchen Difficulty breathing  . Swelling of your face and throat  . A fast heartbeat  . A bad rash all over your body  . Dizziness and weakness    Immunizations Administered    Name Date Dose VIS Date Route   Pfizer COVID-19 Vaccine 05/09/2019  2:16 PM 0.3 mL 03/11/2019 Intramuscular   Manufacturer: [employer], [employer]   Lot: [id number]   NDC: [address]-2

## 2019-05-10 ENCOUNTER — Ambulatory Visit: Payer: Medicare Other

## 2019-06-03 ENCOUNTER — Ambulatory Visit: Payer: Medicare Other | Attending: Internal Medicine

## 2019-06-03 DIAGNOSIS — Z23 Encounter for immunization: Secondary | ICD-10-CM | POA: Insufficient documentation

## 2019-06-03 NOTE — Progress Notes (Signed)
   Covid-19 Vaccination Clinic  Name:  [name]    MRN: [id number] DOB: November 23, 1955  06/03/2019  [name] was observed post Covid-19 immunization for 15 minutes without incident. She was provided with Vaccine Information Sheet and instruction to access the V-Safe system.   [name] was instructed to call 911 with any severe reactions post vaccine: Marland Kitchen Difficulty breathing  . Swelling of face and throat  . A fast heartbeat  . A bad rash all over body  . Dizziness and weakness   Immunizations Administered    Name Date Dose VIS Date Route   Pfizer COVID-19 Vaccine 06/03/2019  8:45 AM 0.3 mL 03/11/2019 Intramuscular   Manufacturer: [employer], [employer]   Lot: [id number]   NDC: [address]-1

## 2019-06-16 IMAGING — US US FNA BIOPSY THYROID 1ST LESION
1 series · 13 of 14 positions shown · non-contrast
Comparison: Thyroid ultrasound on 01/13/2018 and additional prior
studies.

MEDICATIONS:
None

COMPLICATIONS:
None immediate.

INDICATION: Indeterminate thyroid nodule. Multinodular goiter with enlargement
of dominant right thyroid nodule since prior imaging.

EXAM:
ULTRASOUND GUIDED FINE NEEDLE ASPIRATION OF INDETERMINATE THYROID
NODULE
TECHNIQUE: Informed written consent was obtained from the patient after a
discussion of the risks, benefits and alternatives to treatment.
Questions regarding the procedure were encouraged and answered. A
timeout was performed prior to the initiation of the procedure.

[Series 1: us fna biopsy thyroid 1st lesion · 0.06mm/px · 14 acquisitions, 13 frames shown]
[im 1/14]
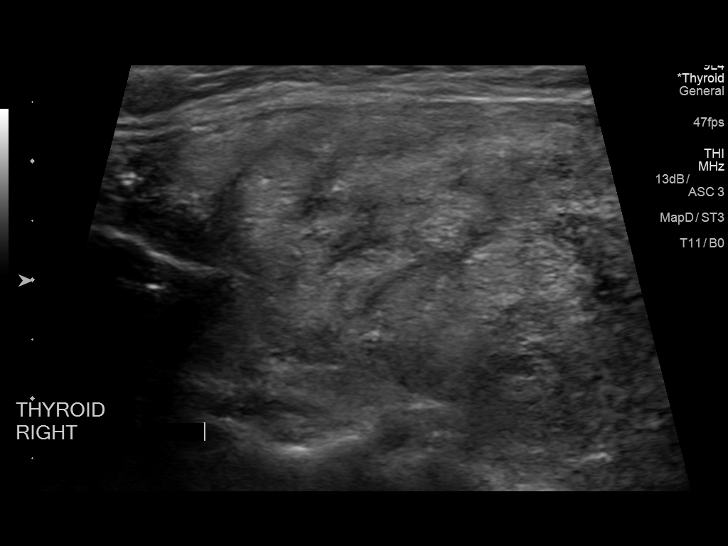
[im 2/14]
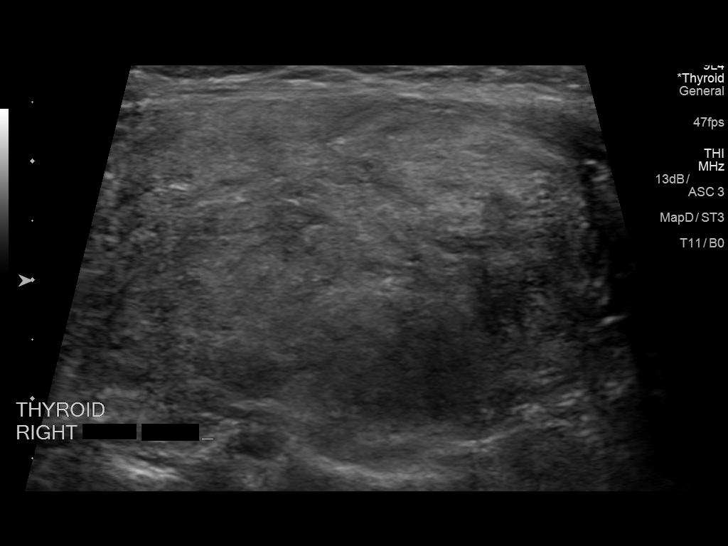
[im 3/14]
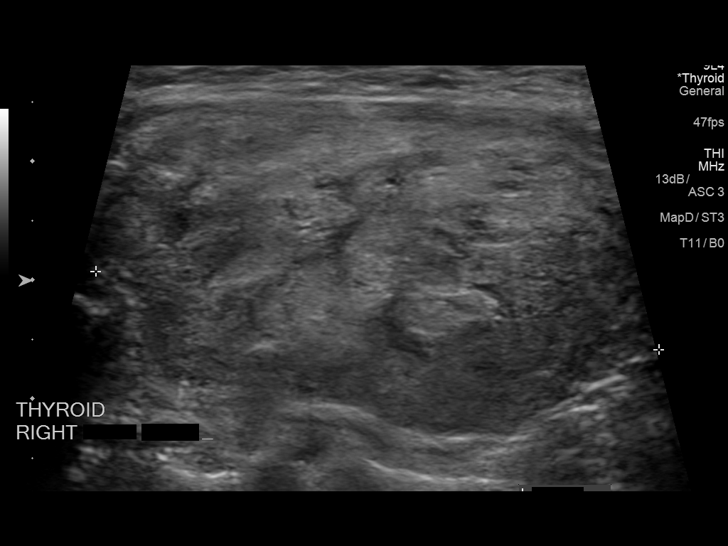
[im 4/14]
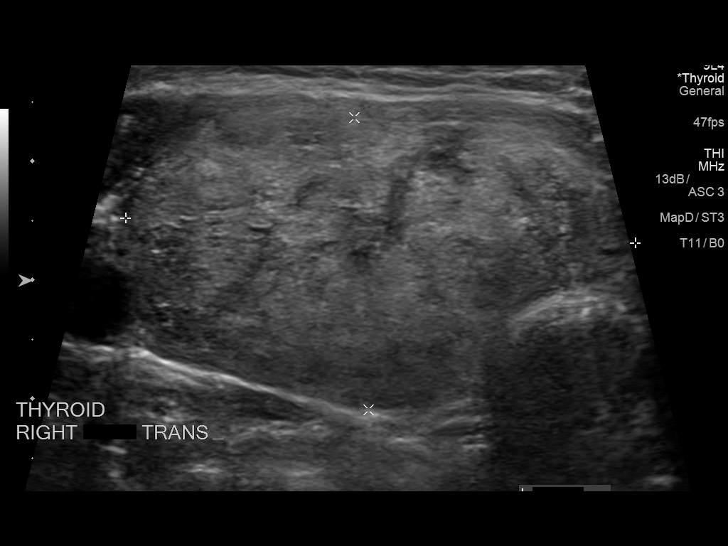
[im 5/14]
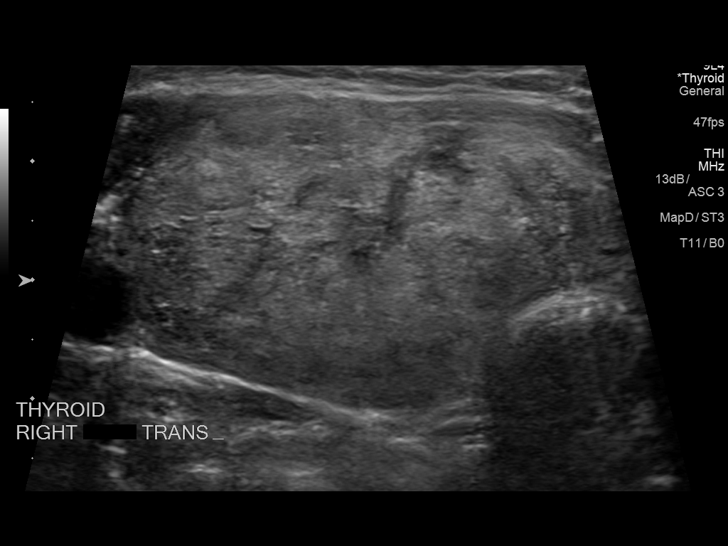
[im 6/14]
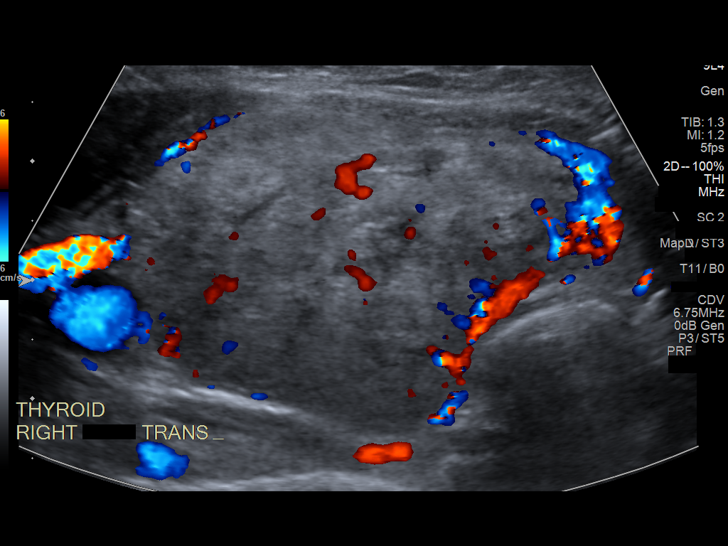
[im 8/14]
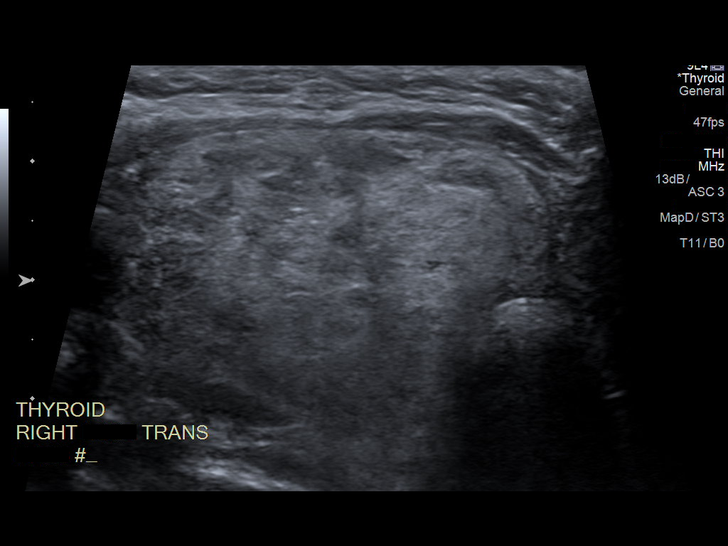
[im 9/14]
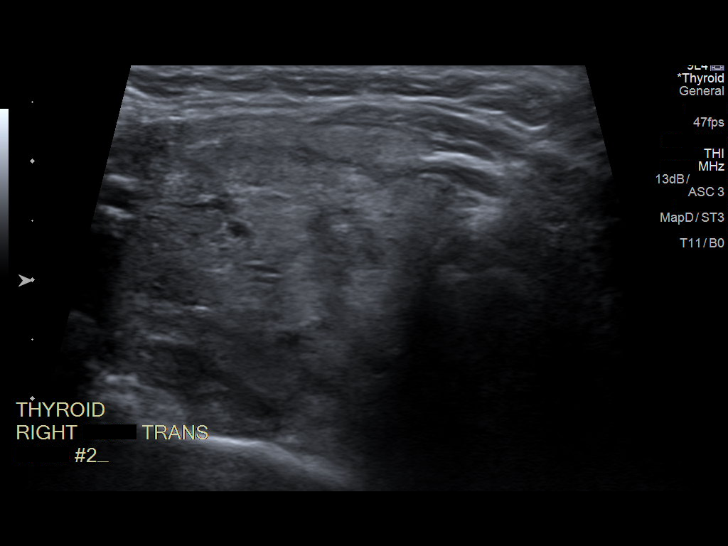
[im 10/14]
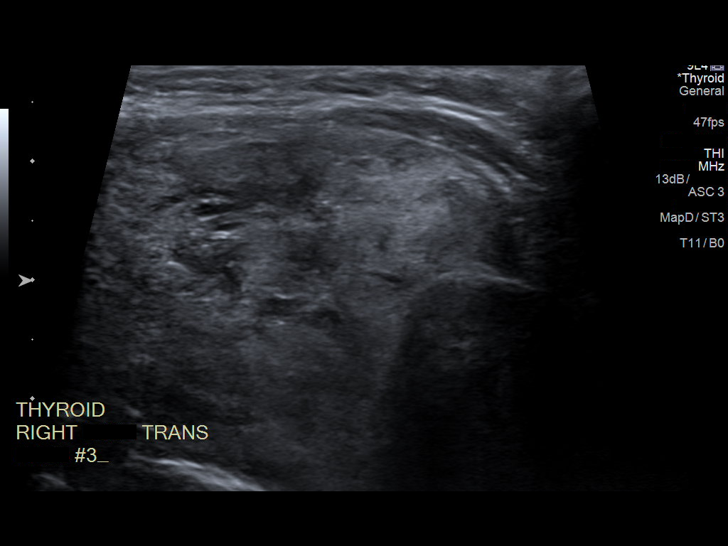
[im 11/14]
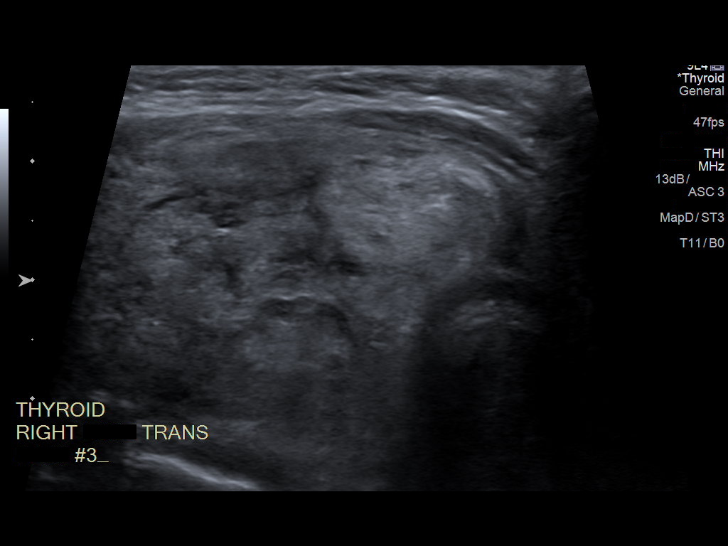
[im 12/14]
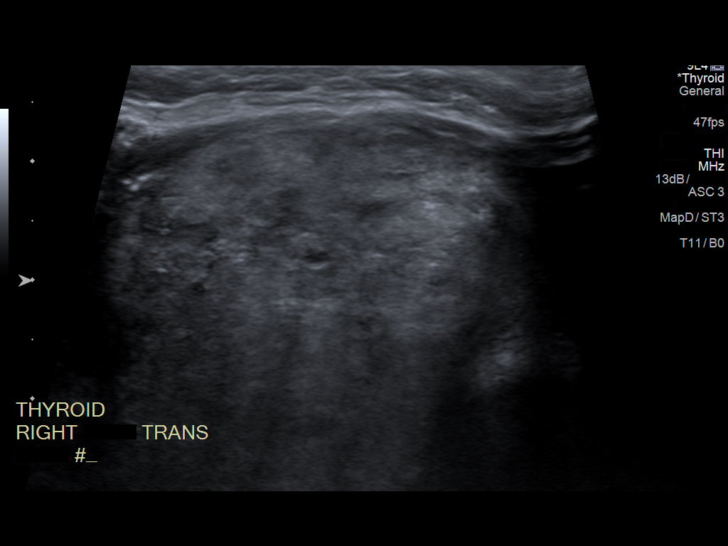
[im 13/14]
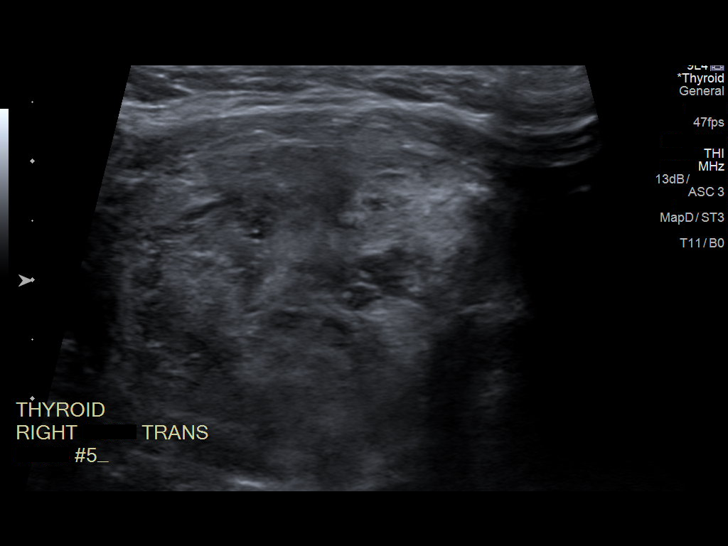
[im 14/14]
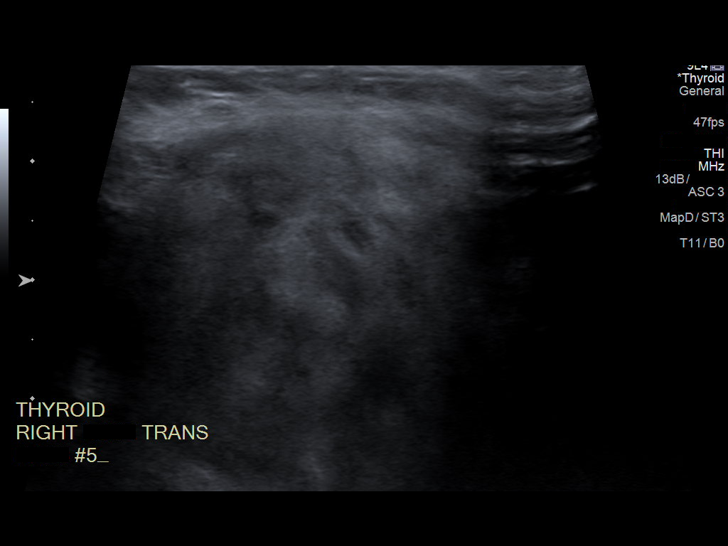

[13 of 14 positions shown; findings below may reference images not displayed]

Pre-procedural ultrasound scanning demonstrated unchanged size and
appearance of the indeterminate nodule within the right lobe.

The procedure was planned. The neck was prepped in the usual sterile
fashion, and a sterile drape was applied covering the operative
field. A timeout was performed prior to the initiation of the
procedure. Local anesthesia was provided with 1% lidocaine.

Under direct ultrasound guidance, 5 FNA biopsies were performed of
the right thyroid nodule with 25 gauge needles. Multiple ultrasound
images were saved for procedural documentation purposes. The samples
were prepared and submitted to pathology.

Limited post procedural scanning was negative for hematoma or
additional complication. Dressings were placed. The patient
tolerated the above procedures procedure well without immediate
postprocedural complication.
FINDINGS: Nodule reference number based on prior diagnostic ultrasound: 1

Maximum size: 4.7 cm

Location: Right; Mid

ACR TI-RADS risk category: TR3 (3 points)

Reason for biopsy: meets ACR TI-RADS criteria

Ultrasound imaging confirms appropriate placement of the needles
within the thyroid nodule.
IMPRESSION: Technically successful ultrasound guided fine needle aspiration of
4.7 cm right mid thyroid nodule.

## 2019-08-24 ENCOUNTER — Ambulatory Visit: Payer: Medicare Other | Admitting: Registered Nurse

## 2019-08-24 ENCOUNTER — Other Ambulatory Visit: Payer: Self-pay

## 2019-08-24 ENCOUNTER — Encounter: Payer: Self-pay | Admitting: Registered Nurse

## 2019-08-24 VITALS — BP 154/80 | HR 74 | Temp 98.0°F | Resp 17 | Ht 66.0 in | Wt 182.4 lb

## 2019-08-24 DIAGNOSIS — S81812A Laceration without foreign body, left lower leg, initial encounter: Secondary | ICD-10-CM

## 2019-08-24 DIAGNOSIS — Z23 Encounter for immunization: Secondary | ICD-10-CM | POA: Diagnosis not present

## 2019-08-24 MED ORDER — DOXYCYCLINE HYCLATE 100 MG PO TABS: 100.0000 mg | ORAL_TABLET | Freq: Two times a day (BID) | ORAL | 0 refills | Status: DC

## 2019-08-24 MED ORDER — TRAMADOL HCL 50 MG PO TABS: 50.0000 mg | ORAL_TABLET | Freq: Three times a day (TID) | ORAL | 0 refills | Status: AC | PRN

## 2019-08-24 MED ORDER — MUPIROCIN 2 % EX OINT: 1.0000 "application " | TOPICAL_OINTMENT | Freq: Two times a day (BID) | CUTANEOUS | 0 refills | Status: DC

## 2019-08-24 MED ORDER — GABAPENTIN 100 MG PO CAPS: 100.0000 mg | ORAL_CAPSULE | Freq: Three times a day (TID) | ORAL | 0 refills | Status: DC

## 2019-08-24 NOTE — Patient Instructions (Signed)
° ° ° °  If you have lab work done today you will be contacted with your lab results within the next 2 weeks.  If you have not heard from us then please contact us. The fastest way to get your results is to register for My Chart. ° ° °IF you received an x-ray today, you will receive an invoice from [facility]. Please contact [facility] at [phone] with questions or concerns regarding your invoice.  ° °IF you received labwork today, you will receive an invoice from [facility]. Please contact [facility] at [phone] with questions or concerns regarding your invoice.  ° °Our billing staff will not be able to assist you with questions regarding bills from these companies. ° °You will be contacted with the lab results as soon as they are available. The fastest way to get your results is to activate your My Chart account. Instructions are located on the last page of this paperwork. If you have not heard from us regarding the results in 2 weeks, please contact this office. °  ° ° ° °

## 2019-08-24 NOTE — Progress Notes (Signed)
Acute Office Visit  Subjective:    Patient ID: Renee Jordan, female    DOB: 24-Jul-1955, 64 y.o.   MRN: [id number]  Chief Complaint  Patient presents with  . Fall    patient states she was at drum practice and got tangled in the straps she fell and jammed left leg in between the drums. Patient leg and ankle is swollen with a bruise she thinks is infected. Patient is experiencing numbness, tingling , burning and sharp pain. Meloxicam was taken for pain but still couldnt sleep    HPI Patient is in today for wound to LLE.  Mechanical trip and fall. LLE took hard impact and laceration from old metal object - perhaps rusted. Last tdap 5+ years ago.  Occurred yesterday. Extremely painful and tender from anterolateral midcalf to upper aspect of lateral malleolus. Some bruising and swelling. Full ROM in ankle and knee. Some numbness around area of laceration.  Laceration does not appear infected but area is very warm to touch, feels swollen and firm No nvd, chills, fever, sweats.   Past Medical History:  Diagnosis Date  . Anemia   . Arthritis   . History of chicken pox   . History of measles, mumps, or rubella   . HPV (human papilloma virus) infection   . Infertility, female   . Joint problem   . Shingles   . Thyroid disease   . Uterine polyp   . Yeast infection     Past Surgical History:  Procedure Laterality Date  . BUNIONECTOMY    . DILATION AND CURETTAGE OF UTERUS    . KNEE SURGERY    . LAPAROSCOPY    . polyp removal    . WISDOM TOOTH EXTRACTION      Family History  Problem Relation Age of Onset  . Heart disease Brother   . Diabetes Brother   . Hyperlipidemia Brother   . Hypertension Brother   . Hearing loss Brother 59       CAD/PTCA  . Asthma Sister   . Emphysema Mother   . Cancer Father 16       lung cancer/prostate cancer  . Hypertension Brother   . Stroke Paternal Grandfather   . Stroke Maternal Grandmother   . Heart disease Maternal Grandfather   .  Stroke Maternal Grandfather     Social History   Socioeconomic History  . Marital status: Married    Spouse name: Not on file  . Number of children: Not on file  . Years of education: Not on file  . Highest education level: Not on file  Occupational History  . Not on file  Tobacco Use  . Smoking status: Never Smoker  . Smokeless tobacco: Never Used  Substance and Sexual Activity  . Alcohol use: No  . Drug use: No  . Sexual activity: Yes    Birth control/protection: Post-menopausal  Other Topics Concern  . Not on file  Social History Narrative   Marital status: married x 21 years      Children: 1 daughter 20; 2 grandchildren (31,8)      Lives: with husband      EMployment: [employer]; full time; happy     Tobacco: never      Alcohol:  None      Exercise: a lot; [employer]; hikes every weekend. 2-3 times per week; hikes a lot; rides bike. Swims weekly.   Social Determinants of Health   Financial Resource Strain:   .  Difficulty of Paying Living Expenses:   Food Insecurity:   . Worried About Programme researcher, broadcasting/film/video in the Last Year:   . Barista in the Last Year:   Transportation Needs:   . Freight forwarder (Medical):   Marland Kitchen Lack of Transportation (Non-Medical):   Physical Activity:   . Days of Exercise per Week:   . Minutes of Exercise per Session:   Stress:   . Feeling of Stress :   Social Connections:   . Frequency of Communication with Friends and Family:   . Frequency of Social Gatherings with Friends and Family:   . Attends Religious Services:   . Active Member of Clubs or Organizations:   . Attends Banker Meetings:   Marland Kitchen Marital Status:   Intimate Partner Violence:   . Fear of Current or Ex-Partner:   . Emotionally Abused:   Marland Kitchen Physically Abused:   . Sexually Abused:     Outpatient Medications Prior to Visit  Medication Sig Dispense Refill  . acetaminophen (TYLENOL) 500 MG tablet Take 500 mg by mouth every 6  (six) hours as needed.    Marland Kitchen albuterol (PROVENTIL HFA;VENTOLIN HFA) 108 (90 Base) MCG/ACT inhaler Inhale 2 puffs into the lungs every 6 (six) hours as needed for wheezing or shortness of breath. 1 Inhaler 0  . cyclobenzaprine (FLEXERIL) 10 MG tablet Take 1 tablet (10 mg total) by mouth 3 (three) times daily as needed for muscle spasms. 90 tablet 1  . diphenhydrAMINE (BENADRYL) 25 MG tablet Take 25 mg by mouth every 6 (six) hours as needed.    Marland Kitchen ipratropium (ATROVENT) 0.03 % nasal spray Place 2 sprays into both nostrils 2 (two) times daily. 30 mL 0  . levothyroxine (SYNTHROID, LEVOTHROID) 50 MCG tablet Take 50 mcg by mouth daily before breakfast.    . meloxicam (MOBIC) 15 MG tablet Take 1 tablet (15 mg total) by mouth daily. 90 tablet 0   No facility-administered medications prior to visit.    Allergies  Allergen Reactions  . Codeine Nausea And Vomiting    Review of Systems Per hpi, otherwise negative    Objective:    Physical Exam Vitals and nursing note reviewed.  Constitutional:      General: She is not in acute distress.    Appearance: Normal appearance. She is normal weight. She is not ill-appearing, toxic-appearing or diaphoretic.  Cardiovascular:     Rate and Rhythm: Normal rate and regular rhythm.  Pulmonary:     Effort: Pulmonary effort is normal. No respiratory distress.  Musculoskeletal:        General: Swelling and tenderness present. No deformity or signs of injury. Normal range of motion.     Right lower leg: No edema.     Left lower leg: No edema.  Skin:    General: Skin is warm and dry.     Capillary Refill: Capillary refill takes less than 2 seconds.     Coloration: Skin is not jaundiced or pale.     Findings: Bruising and lesion present. No erythema or rash.       Neurological:     General: No focal deficit present.     Mental Status: She is alert and oriented to person, place, and time. Mental status is at baseline.     Cranial Nerves: No cranial nerve  deficit.     Sensory: No sensory deficit.     Motor: No weakness.     Coordination: Coordination normal.  Gait: Gait normal.     Deep Tendon Reflexes: Reflexes normal.  Psychiatric:        Mood and Affect: Mood normal.        Behavior: Behavior normal.        Thought Content: Thought content normal.        Judgment: Judgment normal.     BP (!) 154/80   Pulse 74   Temp 98 F (36.7 C) (Temporal)   Resp 17   Ht 5\' 6"  (1.676 m)   Wt 182 lb 6.4 oz (82.7 kg)   SpO2 97%   BMI 29.44 kg/m  Wt Readings from Last 3 Encounters:  08/24/19 182 lb 6.4 oz (82.7 kg)  11/27/17 176 lb 3.2 oz (79.9 kg)  03/20/17 183 lb 6.4 oz (83.2 kg)    There are no preventive care reminders to display for this patient.  There are no preventive care reminders to display for this patient.   No results found for: TSH Lab Results  Component Value Date   WBC 5.9 11/27/2017   HGB 14.0 11/27/2017   HCT 42.5 11/27/2017   MCV 91 11/27/2017   PLT 286 11/27/2017   Lab Results  Component Value Date   NA 141 12/22/2017   K 4.4 12/22/2017   CO2 25 12/22/2017   GLUCOSE 94 12/22/2017   BUN 12 12/22/2017   CREATININE 0.91 12/22/2017   BILITOT 0.4 12/22/2017   ALKPHOS 69 12/22/2017   AST 17 12/22/2017   ALT 16 12/22/2017   PROT 6.6 12/22/2017   ALBUMIN 4.4 12/22/2017   CALCIUM 9.5 12/22/2017   Lab Results  Component Value Date   CHOL 329 (H) 01/30/2017   Lab Results  Component Value Date   HDL 71 01/30/2017   Lab Results  Component Value Date   LDLCALC 219 (H) 01/30/2017   Lab Results  Component Value Date   TRIG 195 (H) 01/30/2017   Lab Results  Component Value Date   CHOLHDL 4.6 (H) 01/30/2017   Lab Results  Component Value Date   HGBA1C 5.6 01/30/2017       Assessment & Plan:   Problem List Items Addressed This Visit    None    Visit Diagnoses    Need for diphtheria-tetanus-pertussis (Tdap) vaccine    -  Primary   Laceration of left lower leg, initial encounter        Relevant Medications   traMADol (ULTRAM) 50 MG tablet   gabapentin (NEURONTIN) 100 MG capsule   doxycycline (VIBRA-TABS) 100 MG tablet   mupirocin ointment (BACTROBAN) 2 %   Other Relevant Orders   Tdap vaccine greater than or equal to 7yo IM       Meds ordered this encounter  Medications  . traMADol (ULTRAM) 50 MG tablet    Sig: Take 1 tablet (50 mg total) by mouth every 8 (eight) hours as needed for up to 5 days.    Dispense:  15 tablet    Refill:  0    Order Specific Question:   Supervising Provider    Answer:   13/05/2016 A Collie Siad  . gabapentin (NEURONTIN) 100 MG capsule    Sig: Take 1 capsule (100 mg total) by mouth 3 (three) times daily.    Dispense:  45 capsule    Refill:  0    Order Specific Question:   Supervising Provider    Answer:   [id number] A Collie Siad  . doxycycline (VIBRA-TABS) 100 MG tablet    Sig:  Take 1 tablet (100 mg total) by mouth 2 (two) times daily.    Dispense:  20 tablet    Refill:  0    Order Specific Question:   Supervising Provider    Answer:   Delia Chimes A [id number]  . mupirocin ointment (BACTROBAN) 2 %    Sig: Place 1 application into the nose 2 (two) times daily.    Dispense:  22 g    Refill:  0    Order Specific Question:   Supervising Provider    Answer:   Forrest Moron [id number]   PLAN  Wound from dirty object  Update tdap  Mupirocin and doxycycline  Tramadol and gabapentin for pain, as well as supplemental tylenol prn  Return precautions reviewed  Patient encouraged to call clinic with any questions, comments, or concerns.  Maximiano Coss, NP

## 2019-09-02 ENCOUNTER — Other Ambulatory Visit: Payer: Self-pay

## 2019-09-02 ENCOUNTER — Ambulatory Visit (INDEPENDENT_AMBULATORY_CARE_PROVIDER_SITE_OTHER): Payer: Medicare Other | Admitting: Registered Nurse

## 2019-09-02 ENCOUNTER — Encounter: Payer: Self-pay | Admitting: Registered Nurse

## 2019-09-02 VITALS — BP 138/79 | HR 67 | Temp 98.0°F | Ht 66.0 in | Wt 183.6 lb

## 2019-09-02 DIAGNOSIS — S81812D Laceration without foreign body, left lower leg, subsequent encounter: Secondary | ICD-10-CM | POA: Diagnosis not present

## 2019-09-02 DIAGNOSIS — M7989 Other specified soft tissue disorders: Secondary | ICD-10-CM

## 2019-09-02 DIAGNOSIS — S81812A Laceration without foreign body, left lower leg, initial encounter: Secondary | ICD-10-CM

## 2019-09-02 MED ORDER — SULFAMETHOXAZOLE-TRIMETHOPRIM 800-160 MG PO TABS: 1.0000 | ORAL_TABLET | Freq: Two times a day (BID) | ORAL | 0 refills | Status: DC

## 2019-09-02 NOTE — Patient Instructions (Signed)
° ° ° °  If you have lab work done today you will be contacted with your lab results within the next 2 weeks.  If you have not heard from us then please contact us. The fastest way to get your results is to register for My Chart. ° ° °IF you received an x-ray today, you will receive an invoice from [facility]. Please contact [facility] at [phone] with questions or concerns regarding your invoice.  ° °IF you received labwork today, you will receive an invoice from [facility]. Please contact [facility] at [phone] with questions or concerns regarding your invoice.  ° °Our billing staff will not be able to assist you with questions regarding bills from these companies. ° °You will be contacted with the lab results as soon as they are available. The fastest way to get your results is to activate your My Chart account. Instructions are located on the last page of this paperwork. If you have not heard from us regarding the results in 2 weeks, please contact this office. °  ° ° ° °

## 2019-09-02 NOTE — Progress Notes (Signed)
Established Patient Office Visit  Subjective:  Patient ID: Renee Jordan, female    DOB: 07/24/1955  Age: 64 y.o. MRN: [id number]  CC:  Chief Complaint  Patient presents with  . Follow-up    Infection on leg. Pt stated that she is still experiencing some burning from the infection and some swelling.    HPI Renee Jordan presents for ongoing swelling and pain in LLE.  Was concerned for infection following initial injury. Has had bruising, warmth, redness ongoing since incident. Laceration appears to be healing well.  Reports a deep burning pain medial to laceration at area of most redness.  No history of clot. Not on blood thinners.  Has been taking gabapentin with decent relief.  No other concerns today. Tolerating doxy well without issue.  Past Medical History:  Diagnosis Date  . Anemia   . Arthritis   . History of chicken pox   . History of measles, mumps, or rubella   . HPV (human papilloma virus) infection   . Infertility, female   . Joint problem   . Shingles   . Thyroid disease   . Uterine polyp   . Yeast infection     Past Surgical History:  Procedure Laterality Date  . BUNIONECTOMY    . DILATION AND CURETTAGE OF UTERUS    . KNEE SURGERY    . LAPAROSCOPY    . polyp removal    . WISDOM TOOTH EXTRACTION      Family History  Problem Relation Age of Onset  . Heart disease Brother   . Diabetes Brother   . Hyperlipidemia Brother   . Hypertension Brother   . Hearing loss Brother 59       CAD/PTCA  . Asthma Sister   . Emphysema Mother   . Cancer Father 53       lung cancer/prostate cancer  . Hypertension Brother   . Stroke Paternal Grandfather   . Stroke Maternal Grandmother   . Heart disease Maternal Grandfather   . Stroke Maternal Grandfather     Social History   Socioeconomic History  . Marital status: Married    Spouse name: Not on file  . Number of children: Not on file  . Years of education: Not on file  . Highest education  level: Not on file  Occupational History  . Not on file  Tobacco Use  . Smoking status: Never Smoker  . Smokeless tobacco: Never Used  Substance and Sexual Activity  . Alcohol use: No  . Drug use: No  . Sexual activity: Yes    Birth control/protection: Post-menopausal  Other Topics Concern  . Not on file  Social History Narrative   Marital status: married x 21 years      Children: 1 daughter 77; 2 grandchildren (79,8)      Lives: with husband      EMployment: [employer]; full time; happy     Tobacco: never      Alcohol:  None      Exercise: a lot; [employer]; hikes every weekend. 2-3 times per week; hikes a lot; rides bike. Swims weekly.   Social Determinants of Health   Financial Resource Strain:   . Difficulty of Paying Living Expenses:   Food Insecurity:   . Worried About Programme researcher, broadcasting/film/video in the Last Year:   . Barista in the Last Year:   Transportation Needs:   . Freight forwarder (Medical):   Marland Kitchen  Lack of Transportation (Non-Medical):   Physical Activity:   . Days of Exercise per Week:   . Minutes of Exercise per Session:   Stress:   . Feeling of Stress :   Social Connections:   . Frequency of Communication with Friends and Family:   . Frequency of Social Gatherings with Friends and Family:   . Attends Religious Services:   . Active Member of Clubs or Organizations:   . Attends Banker Meetings:   Marland Kitchen Marital Status:   Intimate Partner Violence:   . Fear of Current or Ex-Partner:   . Emotionally Abused:   Marland Kitchen Physically Abused:   . Sexually Abused:     Outpatient Medications Prior to Visit  Medication Sig Dispense Refill  . acetaminophen (TYLENOL) 500 MG tablet Take 500 mg by mouth every 6 (six) hours as needed.    Marland Kitchen albuterol (PROVENTIL HFA;VENTOLIN HFA) 108 (90 Base) MCG/ACT inhaler Inhale 2 puffs into the lungs every 6 (six) hours as needed for wheezing or shortness of breath. 1 Inhaler 0  . cyclobenzaprine  (FLEXERIL) 10 MG tablet Take 1 tablet (10 mg total) by mouth 3 (three) times daily as needed for muscle spasms. 90 tablet 1  . diphenhydrAMINE (BENADRYL) 25 MG tablet Take 25 mg by mouth every 6 (six) hours as needed.    . doxycycline (VIBRA-TABS) 100 MG tablet Take 1 tablet (100 mg total) by mouth 2 (two) times daily. 20 tablet 0  . gabapentin (NEURONTIN) 100 MG capsule Take 1 capsule (100 mg total) by mouth 3 (three) times daily. 45 capsule 0  . ipratropium (ATROVENT) 0.03 % nasal spray Place 2 sprays into both nostrils 2 (two) times daily. 30 mL 0  . levothyroxine (SYNTHROID, LEVOTHROID) 50 MCG tablet Take 50 mcg by mouth daily before breakfast.    . meloxicam (MOBIC) 15 MG tablet Take 1 tablet (15 mg total) by mouth daily. 90 tablet 0  . mupirocin ointment (BACTROBAN) 2 % Place 1 application into the nose 2 (two) times daily. 22 g 0  . traMADol (ULTRAM) 50 MG tablet Take by mouth every 6 (six) hours as needed.     No facility-administered medications prior to visit.    Allergies  Allergen Reactions  . Codeine Nausea And Vomiting    ROS Review of Systems    Per hpi, otherwise negative Objective:    Physical Exam  Constitutional: She is oriented to person, place, and time.  Musculoskeletal:        General: Tenderness and edema present. No deformity. Normal range of motion.  Neurological: She is alert and oriented to person, place, and time. No cranial nerve deficit. She exhibits normal muscle tone. Coordination normal.  Skin: Skin is warm and dry. No rash noted. There is erythema. No pallor.  Psychiatric: She has a normal mood and affect. Her behavior is normal. Judgment and thought content normal.    BP 138/79 (BP Location: Right Arm, Patient Position: Sitting, Cuff Size: Normal)   Pulse 67   Temp 98 F (36.7 C) (Temporal)   Ht 5\' 6"  (1.676 m)   Wt 183 lb 9.6 oz (83.3 kg)   SpO2 96%   BMI 29.63 kg/m  Wt Readings from Last 3 Encounters:  09/02/19 183 lb 9.6 oz (83.3 kg)    08/24/19 182 lb 6.4 oz (82.7 kg)  11/27/17 176 lb 3.2 oz (79.9 kg)     Health Maintenance Due  Topic Date Due  . PAP SMEAR-Modifier  04/01/2019  There are no preventive care reminders to display for this patient.  No results found for: TSH Lab Results  Component Value Date   WBC 5.9 11/27/2017   HGB 14.0 11/27/2017   HCT 42.5 11/27/2017   MCV 91 11/27/2017   PLT 286 11/27/2017   Lab Results  Component Value Date   NA 141 12/22/2017   K 4.4 12/22/2017   CO2 25 12/22/2017   GLUCOSE 94 12/22/2017   BUN 12 12/22/2017   CREATININE 0.91 12/22/2017   BILITOT 0.4 12/22/2017   ALKPHOS 69 12/22/2017   AST 17 12/22/2017   ALT 16 12/22/2017   PROT 6.6 12/22/2017   ALBUMIN 4.4 12/22/2017   CALCIUM 9.5 12/22/2017   Lab Results  Component Value Date   CHOL 329 (H) 01/30/2017   Lab Results  Component Value Date   HDL 71 01/30/2017   Lab Results  Component Value Date   LDLCALC 219 (H) 01/30/2017   Lab Results  Component Value Date   TRIG 195 (H) 01/30/2017   Lab Results  Component Value Date   CHOLHDL 4.6 (H) 01/30/2017   Lab Results  Component Value Date   HGBA1C 5.6 01/30/2017      Assessment & Plan:   Problem List Items Addressed This Visit    None    Visit Diagnoses    Left leg swelling    -  Primary   Relevant Orders   VAS [place] LOWER EXTREMITY VENOUS (DVT)      No orders of the defined types were placed in this encounter.   Follow-up: No follow-ups on file.   PLAN  Laceration appear to be healing, but bruising, erythema, and warmth are worse. Will get doppler to r/o DVT  Reviewed red flags  Will send bactrim to follow up doxy in case of further infection/cellutlitis  Patient encouraged to call clinic with any questions, comments, or concerns.  Maximiano Coss, NP

## 2019-09-08 ENCOUNTER — Telehealth: Payer: Self-pay | Admitting: Registered Nurse

## 2019-09-08 ENCOUNTER — Ambulatory Visit (HOSPITAL_COMMUNITY)
Admission: RE | Admit: 2019-09-08 | Discharge: 2019-09-08 | Disposition: A | Discharge status: Home / Self Care | Payer: Medicare Other | Source: Ambulatory Visit | Attending: Registered Nurse | Admitting: Registered Nurse

## 2019-09-08 ENCOUNTER — Encounter (HOSPITAL_COMMUNITY): Payer: Medicare Other

## 2019-09-08 ENCOUNTER — Other Ambulatory Visit: Payer: Self-pay

## 2019-09-08 DIAGNOSIS — M7989 Other specified soft tissue disorders: Secondary | ICD-10-CM | POA: Diagnosis present

## 2019-09-08 NOTE — Telephone Encounter (Signed)
fyi

## 2019-09-08 NOTE — Telephone Encounter (Signed)
Tammy Sours with [facility] calling to report for pt   LFT lower extremity venus duplex / she is neg for DVT

## 2019-09-08 NOTE — Progress Notes (Signed)
Left lower extremity venous duplex has been completed. Preliminary results can be found in CV Proc through chart review.  Results were given to [facility] at Sherlyn Lees NP office.  09/08/19 4:43 PM Olen Cordial RVT

## 2019-09-09 ENCOUNTER — Other Ambulatory Visit: Payer: Self-pay

## 2019-09-09 ENCOUNTER — Encounter: Payer: Self-pay | Admitting: Registered Nurse

## 2019-09-09 ENCOUNTER — Ambulatory Visit (INDEPENDENT_AMBULATORY_CARE_PROVIDER_SITE_OTHER): Payer: Medicare Other | Admitting: Registered Nurse

## 2019-09-09 VITALS — BP 135/82 | HR 68 | Temp 98.0°F | Resp 18 | Ht 66.0 in | Wt 181.2 lb

## 2019-09-09 DIAGNOSIS — S81812D Laceration without foreign body, left lower leg, subsequent encounter: Secondary | ICD-10-CM

## 2019-09-09 DIAGNOSIS — R600 Localized edema: Secondary | ICD-10-CM

## 2019-09-09 DIAGNOSIS — R609 Edema, unspecified: Secondary | ICD-10-CM

## 2019-09-09 DIAGNOSIS — Z13220 Encounter for screening for lipoid disorders: Secondary | ICD-10-CM

## 2019-09-09 DIAGNOSIS — Z8639 Personal history of other endocrine, nutritional and metabolic disease: Secondary | ICD-10-CM

## 2019-09-09 DIAGNOSIS — E663 Overweight: Secondary | ICD-10-CM

## 2019-09-09 DIAGNOSIS — S81812A Laceration without foreign body, left lower leg, initial encounter: Secondary | ICD-10-CM

## 2019-09-09 LAB — URINALYSIS
Bilirubin, UA: NEGATIVE
Glucose, UA: NEGATIVE
Ketones, UA: NEGATIVE
Leukocytes,UA: NEGATIVE
Nitrite, UA: NEGATIVE
Protein,UA: NEGATIVE
Specific Gravity, UA: 1.009 (ref 1.005–1.030)
Urobilinogen, Ur: 0.2 mg/dL (ref 0.2–1.0)
pH, UA: 6 (ref 5.0–7.5)

## 2019-09-09 MED ORDER — LEVOFLOXACIN 500 MG PO TABS: 500.0000 mg | ORAL_TABLET | Freq: Every day | ORAL | 0 refills | Status: DC

## 2019-09-09 MED ORDER — CEPHALEXIN 500 MG PO CAPS: 500.0000 mg | ORAL_CAPSULE | Freq: Four times a day (QID) | ORAL | 0 refills | Status: DC

## 2019-09-09 NOTE — Progress Notes (Signed)
Established Patient Office Visit  Subjective:  Patient ID: Renee Jordan, female    DOB: 02/13/1956  Age: 64 y.o. MRN: [id number]  CC:  Chief Complaint  Patient presents with  . Follow-up    Patient states she is here beacause she is still having swelling with left leg . patient states she would like to discuss a referral and more antibiotics    HPI Renee Jordan presents for ongoing swelling and apparent delayed healing from wound LLE continues to be red, warm, swollen. No spreading redness or streaking. Denies systemic symptoms of infection. We have completed US doppler ruling out DVT. No history of clotting. Some ongoing mild pain but no swelling in foot, no numbness burning or tingling.  Interested in seeing a vascular specialist to discuss possible pvd  Otherwise no concerns.   Past Medical History:  Diagnosis Date  . Anemia   . Arthritis   . History of chicken pox   . History of measles, mumps, or rubella   . HPV (human papilloma virus) infection   . Infertility, female   . Joint problem   . Shingles   . Thyroid disease   . Uterine polyp   . Yeast infection     Past Surgical History:  Procedure Laterality Date  . BUNIONECTOMY    . DILATION AND CURETTAGE OF UTERUS    . KNEE SURGERY    . LAPAROSCOPY    . polyp removal    . WISDOM TOOTH EXTRACTION      Family History  Problem Relation Age of Onset  . Heart disease Brother   . Diabetes Brother   . Hyperlipidemia Brother   . Hypertension Brother   . Hearing loss Brother 59       CAD/PTCA  . Asthma Sister   . Emphysema Mother   . Cancer Father 10       lung cancer/prostate cancer  . Hypertension Brother   . Stroke Paternal Grandfather   . Stroke Maternal Grandmother   . Heart disease Maternal Grandfather   . Stroke Maternal Grandfather     Social History   Socioeconomic History  . Marital status: Married    Spouse name: Not on file  . Number of children: Not on file  . Years of  education: Not on file  . Highest education level: Not on file  Occupational History  . Not on file  Tobacco Use  . Smoking status: Never Smoker  . Smokeless tobacco: Never Used  Vaping Use  . Vaping Use: Never used  Substance and Sexual Activity  . Alcohol use: No  . Drug use: No  . Sexual activity: Yes    Birth control/protection: Post-menopausal  Other Topics Concern  . Not on file  Social History Narrative   Marital status: married x 21 years      Children: 1 daughter 7; 2 grandchildren (79,8)      Lives: with husband      EMployment: [employer]; full time; happy     Tobacco: never      Alcohol:  None      Exercise: a lot; [employer]; hikes every weekend. 2-3 times per week; hikes a lot; rides bike. Swims weekly.   Social Determinants of Health   Financial Resource Strain:   . Difficulty of Paying Living Expenses:   Food Insecurity:   . Worried About Programme researcher, broadcasting/film/video in the Last Year:   . [employer] of Food in the  Last Year:   Transportation Needs:   . Freight forwarder (Medical):   Marland Kitchen Lack of Transportation (Non-Medical):   Physical Activity:   . Days of Exercise per Week:   . Minutes of Exercise per Session:   Stress:   . Feeling of Stress :   Social Connections:   . Frequency of Communication with Friends and Family:   . Frequency of Social Gatherings with Friends and Family:   . Attends Religious Services:   . Active Member of Clubs or Organizations:   . Attends Banker Meetings:   Marland Kitchen Marital Status:   Intimate Partner Violence:   . Fear of Current or Ex-Partner:   . Emotionally Abused:   Marland Kitchen Physically Abused:   . Sexually Abused:     Outpatient Medications Prior to Visit  Medication Sig Dispense Refill  . acetaminophen (TYLENOL) 500 MG tablet Take 500 mg by mouth every 6 (six) hours as needed.    Marland Kitchen albuterol (PROVENTIL HFA;VENTOLIN HFA) 108 (90 Base) MCG/ACT inhaler Inhale 2 puffs into the lungs every 6 (six)  hours as needed for wheezing or shortness of breath. 1 Inhaler 0  . cyclobenzaprine (FLEXERIL) 10 MG tablet Take 1 tablet (10 mg total) by mouth 3 (three) times daily as needed for muscle spasms. 90 tablet 1  . diphenhydrAMINE (BENADRYL) 25 MG tablet Take 25 mg by mouth every 6 (six) hours as needed.    . gabapentin (NEURONTIN) 100 MG capsule Take 1 capsule (100 mg total) by mouth 3 (three) times daily. 45 capsule 0  . ipratropium (ATROVENT) 0.03 % nasal spray Place 2 sprays into both nostrils 2 (two) times daily. 30 mL 0  . levothyroxine (SYNTHROID, LEVOTHROID) 50 MCG tablet Take 50 mcg by mouth daily before breakfast.    . meloxicam (MOBIC) 15 MG tablet Take 1 tablet (15 mg total) by mouth daily. 90 tablet 0  . mupirocin ointment (BACTROBAN) 2 % Place 1 application into the nose 2 (two) times daily. 22 g 0  . traMADol (ULTRAM) 50 MG tablet Take by mouth every 6 (six) hours as needed.    . doxycycline (VIBRA-TABS) 100 MG tablet Take 1 tablet (100 mg total) by mouth 2 (two) times daily. (Patient not taking: Reported on 09/09/2019) 20 tablet 0  . sulfamethoxazole-trimethoprim (BACTRIM DS) 800-160 MG tablet Take 1 tablet by mouth 2 (two) times daily. (Patient not taking: Reported on 09/09/2019) 10 tablet 0   No facility-administered medications prior to visit.    Allergies  Allergen Reactions  . Codeine Nausea And Vomiting    ROS Review of Systems Per hpi     Objective:    Physical Exam Vitals and nursing note reviewed.  Constitutional:      Appearance: Normal appearance. She is normal weight.  Cardiovascular:     Rate and Rhythm: Normal rate and regular rhythm.     Pulses: Normal pulses.  Pulmonary:     Effort: Pulmonary effort is normal. No respiratory distress.  Musculoskeletal:        General: Swelling (mild lle) and tenderness (mild lle) present. No deformity or signs of injury. Normal range of motion.     Right lower leg: No edema.     Left lower leg: No edema.  Skin:     General: Skin is warm and dry.     Capillary Refill: Capillary refill takes less than 2 seconds.     Coloration: Skin is not jaundiced or pale.     Findings: Bruising and  erythema present. No lesion or rash.  Neurological:     General: No focal deficit present.     Mental Status: She is alert and oriented to person, place, and time. Mental status is at baseline.  Psychiatric:        Mood and Affect: Mood normal.        Behavior: Behavior normal.        Thought Content: Thought content normal.        Judgment: Judgment normal.     BP 135/82   Pulse 68   Temp 98 F (36.7 C) (Temporal)   Resp 18   Ht 5\' 6"  (1.676 m)   Wt 181 lb 3.2 oz (82.2 kg)   SpO2 96%   BMI 29.25 kg/m  Wt Readings from Last 3 Encounters:  09/09/19 181 lb 3.2 oz (82.2 kg)  09/02/19 183 lb 9.6 oz (83.3 kg)  08/24/19 182 lb 6.4 oz (82.7 kg)     There are no preventive care reminders to display for this patient.  There are no preventive care reminders to display for this patient.  No results found for: TSH Lab Results  Component Value Date   WBC 5.9 11/27/2017   HGB 14.0 11/27/2017   HCT 42.5 11/27/2017   MCV 91 11/27/2017   PLT 286 11/27/2017   Lab Results  Component Value Date   NA 141 12/22/2017   K 4.4 12/22/2017   CO2 25 12/22/2017   GLUCOSE 94 12/22/2017   BUN 12 12/22/2017   CREATININE 0.91 12/22/2017   BILITOT 0.4 12/22/2017   ALKPHOS 69 12/22/2017   AST 17 12/22/2017   ALT 16 12/22/2017   PROT 6.6 12/22/2017   ALBUMIN 4.4 12/22/2017   CALCIUM 9.5 12/22/2017   Lab Results  Component Value Date   CHOL 329 (H) 01/30/2017   Lab Results  Component Value Date   HDL 71 01/30/2017   Lab Results  Component Value Date   LDLCALC 219 (H) 01/30/2017   Lab Results  Component Value Date   TRIG 195 (H) 01/30/2017   Lab Results  Component Value Date   CHOLHDL 4.6 (H) 01/30/2017   Lab Results  Component Value Date   HGBA1C 5.6 01/30/2017      Assessment & Plan:   Problem  List Items Addressed This Visit    None    Visit Diagnoses    Dependent edema    -  Primary   Relevant Orders   Ambulatory referral to Vascular Surgery   CBC With Differential   Comprehensive metabolic panel   TSH   Lipid panel   POCT glycosylated hemoglobin (Hb A1C)   Urinalysis   Brain natriuretic peptide   History of elevated glucose       Relevant Orders   Lipid panel   POCT glycosylated hemoglobin (Hb A1C)   Lipid screening       Relevant Orders   Lipid panel   Localized edema        Relevant Orders   CBC With Differential   Lipid panel   Brain natriuretic peptide   Overweight        Relevant Orders   Lipid panel      No orders of the defined types were placed in this encounter.   Follow-up: No follow-ups on file.   PLAN  Will refer to vascular  Labs collected, will follow up as warranted  Patient encouraged to call clinic with any questions, comments, or concerns.  Maximiano Coss, NP

## 2019-09-09 NOTE — Telephone Encounter (Signed)
Thanks -  Sent message to patient   Jari Sportsman, NP

## 2019-09-09 NOTE — Patient Instructions (Signed)
° ° ° °  If you have lab work done today you will be contacted with your lab results within the next 2 weeks.  If you have not heard from us then please contact us. The fastest way to get your results is to register for My Chart. ° ° °IF you received an x-ray today, you will receive an invoice from [facility]. Please contact [facility] at [phone] with questions or concerns regarding your invoice.  ° °IF you received labwork today, you will receive an invoice from [facility]. Please contact [facility] at [phone] with questions or concerns regarding your invoice.  ° °Our billing staff will not be able to assist you with questions regarding bills from these companies. ° °You will be contacted with the lab results as soon as they are available. The fastest way to get your results is to activate your My Chart account. Instructions are located on the last page of this paperwork. If you have not heard from us regarding the results in 2 weeks, please contact this office. °  ° ° ° °

## 2019-09-10 LAB — LIPID PANEL
Chol/HDL Ratio: 5.7 ratio — ABNORMAL HIGH (ref 0.0–4.4)
Cholesterol, Total: 327 mg/dL — ABNORMAL HIGH (ref 100–199)
HDL: 57 mg/dL (ref >39)
LDL Chol Calc (NIH): 224 mg/dL — ABNORMAL HIGH (ref 0–99)
Triglycerides: 229 mg/dL — ABNORMAL HIGH (ref 0–149)
VLDL Cholesterol Cal: 46 mg/dL — ABNORMAL HIGH (ref 5–40)

## 2019-09-10 LAB — CBC WITH DIFFERENTIAL
Basophils Absolute: 0.1 10*3/uL (ref 0.0–0.2)
Basos: 1 %
EOS (ABSOLUTE): 0.4 10*3/uL (ref 0.0–0.4)
Eos: 7 %
Hematocrit: 42.9 % (ref 34.0–46.6)
Hemoglobin: 14.3 g/dL (ref 11.1–15.9)
Immature Grans (Abs): 0 10*3/uL (ref 0.0–0.1)
Immature Granulocytes: 0 %
Lymphocytes Absolute: 2.4 10*3/uL (ref 0.7–3.1)
Lymphs: 42 %
MCH: 30.6 pg (ref 26.6–33.0)
MCHC: 33.3 g/dL (ref 31.5–35.7)
MCV: 92 fL (ref 79–97)
Monocytes Absolute: 0.6 10*3/uL (ref 0.1–0.9)
Monocytes: 11 %
Neutrophils Absolute: 2.2 10*3/uL (ref 1.4–7.0)
Neutrophils: 39 %
RBC: 4.68 x10E6/uL (ref 3.77–5.28)
RDW: 12.8 % (ref 11.7–15.4)
WBC: 5.8 10*3/uL (ref 3.4–10.8)

## 2019-09-10 LAB — COMPREHENSIVE METABOLIC PANEL
ALT: 18 IU/L (ref 0–32)
AST: 19 IU/L (ref 0–40)
Albumin/Globulin Ratio: 1.9 (ref 1.2–2.2)
Albumin: 4.3 g/dL (ref 3.8–4.8)
Alkaline Phosphatase: 75 IU/L (ref 48–121)
BUN/Creatinine Ratio: 20 (ref 12–28)
BUN: 20 mg/dL (ref 8–27)
Bilirubin Total: 0.3 mg/dL (ref 0.0–1.2)
CO2: 21 mmol/L (ref 20–29)
Calcium: 9.6 mg/dL (ref 8.7–10.3)
Chloride: 102 mmol/L (ref 96–106)
Creatinine, Ser: 1.02 mg/dL — ABNORMAL HIGH (ref 0.57–1.00)
GFR calc Af Amer: 67 mL/min/{1.73_m2} (ref >59)
GFR calc non Af Amer: 58 mL/min/{1.73_m2} — ABNORMAL LOW (ref >59)
Globulin, Total: 2.3 g/dL (ref 1.5–4.5)
Glucose: 91 mg/dL (ref 65–99)
Potassium: 4.7 mmol/L (ref 3.5–5.2)
Sodium: 139 mmol/L (ref 134–144)
Total Protein: 6.6 g/dL (ref 6.0–8.5)

## 2019-09-10 LAB — BRAIN NATRIURETIC PEPTIDE: BNP: 11 pg/mL (ref 0.0–100.0)

## 2019-09-10 LAB — TSH: TSH: 2.37 u[IU]/mL (ref 0.450–4.500)

## 2019-09-13 ENCOUNTER — Telehealth: Payer: Self-pay

## 2019-09-13 NOTE — Telephone Encounter (Signed)
Pt saw you Friday 6/11 for edema and states she did not receive the two Rx you had told her about I see one Rx for Levoquin but nothing else. Please advise the note did not show that there was supposed to be any ordered unsure what medications shes expecting.

## 2019-09-13 NOTE — Telephone Encounter (Signed)
Copied from CRM [id number]. Topic: General - Inquiry >> Sep 09, 2019  2:47 PM Leary Roca wrote: Reason for CRM: Pt was seen today in office and was told that she would receive 2 prescriptions. Pt went to pharmacy of choice and didn't have any meds . Please advise

## 2019-09-16 ENCOUNTER — Telehealth: Payer: Self-pay

## 2019-09-16 NOTE — Telephone Encounter (Signed)
Pt sent MyChart message stating she would like the referral to vascular as discussed as the problem is not worsening but still has not improved. Could you send that in for her?

## 2019-09-20 ENCOUNTER — Encounter: Payer: Self-pay | Admitting: Registered Nurse

## 2019-09-20 NOTE — Telephone Encounter (Signed)
Pt waiting to hear from Vascular but need the note signed before he can be seen, could you sign that note so the pt may schedule with their office. Thank you!

## 2019-09-21 ENCOUNTER — Encounter: Payer: Self-pay | Admitting: Registered Nurse

## 2019-10-14 ENCOUNTER — Encounter: Payer: Self-pay | Admitting: Registered Nurse

## 2019-10-17 ENCOUNTER — Other Ambulatory Visit: Payer: Self-pay

## 2019-10-17 DIAGNOSIS — M7989 Other specified soft tissue disorders: Secondary | ICD-10-CM

## 2019-10-17 NOTE — Telephone Encounter (Signed)
Please Advise

## 2019-10-26 ENCOUNTER — Ambulatory Visit (HOSPITAL_COMMUNITY)
Admission: RE | Admit: 2019-10-26 | Discharge: 2019-10-26 | Disposition: A | Discharge status: Home / Self Care | Payer: Medicare Other | Source: Ambulatory Visit | Attending: Vascular Surgery | Admitting: Vascular Surgery

## 2019-10-26 ENCOUNTER — Ambulatory Visit (INDEPENDENT_AMBULATORY_CARE_PROVIDER_SITE_OTHER): Payer: Medicare Other | Admitting: Physician Assistant

## 2019-10-26 ENCOUNTER — Other Ambulatory Visit: Payer: Self-pay

## 2019-10-26 VITALS — BP 126/81 | HR 80 | Temp 97.5°F | Resp 16 | Ht 66.0 in | Wt 181.5 lb

## 2019-10-26 DIAGNOSIS — M7989 Other specified soft tissue disorders: Secondary | ICD-10-CM | POA: Diagnosis present

## 2019-10-26 DIAGNOSIS — I872 Venous insufficiency (chronic) (peripheral): Secondary | ICD-10-CM | POA: Diagnosis not present

## 2019-10-26 NOTE — Progress Notes (Signed)
Office Note     CC:  follow up Requesting Provider:  Janeece Agee, NP  HPI: Renee Jordan is a 64 y.o. (July 26, 1955) female who presents for evaluation of left lower extremity edema.  Several weeks ago patient states she dropped a portion of her drum set onto her left lateral ankle.  She still has bruising and noticed an increase in swelling.  She denies any history of DVT or venous ulcerations.  She has been elevating her legs when possible during the day.  She has not tried compression.  She denies tobacco use.  She also denies any claudication, rest pain, or tissue loss of bilateral lower extremities.   Past Medical History:  Diagnosis Date  . Anemia   . Arthritis   . History of chicken pox   . History of measles, mumps, or rubella   . HPV (human papilloma virus) infection   . Infertility, female   . Joint problem   . Shingles   . Thyroid disease   . Uterine polyp   . Yeast infection     Past Surgical History:  Procedure Laterality Date  . BUNIONECTOMY    . DILATION AND CURETTAGE OF UTERUS    . KNEE SURGERY    . LAPAROSCOPY    . polyp removal    . WISDOM TOOTH EXTRACTION      Social History   Socioeconomic History  . Marital status: Married    Spouse name: Not on file  . Number of children: Not on file  . Years of education: Not on file  . Highest education level: Not on file  Occupational History  . Not on file  Tobacco Use  . Smoking status: Never Smoker  . Smokeless tobacco: Never Used  Vaping Use  . Vaping Use: Never used  Substance and Sexual Activity  . Alcohol use: No  . Drug use: No  . Sexual activity: Yes    Birth control/protection: Post-menopausal  Other Topics Concern  . Not on file  Social History Narrative   Marital status: married x 21 years      Children: 1 daughter 66; 2 grandchildren (42,8)      Lives: with husband      EMployment: [employer]; full time; happy     Tobacco: never      Alcohol:  None       Exercise: a lot; [employer]; hikes every weekend. 2-3 times per week; hikes a lot; rides bike. Swims weekly.   Social Determinants of Health   Financial Resource Strain:   . Difficulty of Paying Living Expenses:   Food Insecurity:   . Worried About Programme researcher, broadcasting/film/video in the Last Year:   . [employer] in the Last Year:   Transportation Needs:   . Freight forwarder (Medical):   Marland Kitchen Lack of Transportation (Non-Medical):   Physical Activity:   . Days of Exercise per Week:   . Minutes of Exercise per Session:   Stress:   . Feeling of Stress :   Social Connections:   . Frequency of Communication with Friends and Family:   . Frequency of Social Gatherings with Friends and Family:   . Attends Religious Services:   . Active Member of Clubs or Organizations:   . Attends Banker Meetings:   Marland Kitchen Marital Status:   Intimate Partner Violence:   . Fear of Current or Ex-Partner:   . Emotionally Abused:   Marland Kitchen Physically Abused:   .  Sexually Abused:     Family History  Problem Relation Age of Onset  . Heart disease Brother   . Diabetes Brother   . Hyperlipidemia Brother   . Hypertension Brother   . Hearing loss Brother 59       CAD/PTCA  . Asthma Sister   . Emphysema Mother   . Cancer Father 85       lung cancer/prostate cancer  . Hypertension Brother   . Stroke Paternal Grandfather   . Stroke Maternal Grandmother   . Heart disease Maternal Grandfather   . Stroke Maternal Grandfather     Current Outpatient Medications  Medication Sig Dispense Refill  . acetaminophen (TYLENOL) 500 MG tablet Take 500 mg by mouth every 6 (six) hours as needed.    . cyclobenzaprine (FLEXERIL) 10 MG tablet Take 1 tablet (10 mg total) by mouth 3 (three) times daily as needed for muscle spasms. 90 tablet 1  . diphenhydrAMINE (BENADRYL) 25 MG tablet Take 25 mg by mouth every 6 (six) hours as needed.    Marland Kitchen levothyroxine (SYNTHROID, LEVOTHROID) 50 MCG tablet Take 50 mcg by mouth daily  before breakfast.    . meloxicam (MOBIC) 15 MG tablet Take 1 tablet (15 mg total) by mouth daily. 90 tablet 0  . albuterol (PROVENTIL HFA;VENTOLIN HFA) 108 (90 Base) MCG/ACT inhaler Inhale 2 puffs into the lungs every 6 (six) hours as needed for wheezing or shortness of breath. 1 Inhaler 0  . cephALEXin (KEFLEX) 500 MG capsule Take 1 capsule (500 mg total) by mouth 4 (four) times daily. 40 capsule 0  . doxycycline (VIBRA-TABS) 100 MG tablet Take 1 tablet (100 mg total) by mouth 2 (two) times daily. (Patient not taking: Reported on 09/09/2019) 20 tablet 0  . gabapentin (NEURONTIN) 100 MG capsule Take 1 capsule (100 mg total) by mouth 3 (three) times daily. 45 capsule 0  . ipratropium (ATROVENT) 0.03 % nasal spray Place 2 sprays into both nostrils 2 (two) times daily. 30 mL 0  . levofloxacin (LEVAQUIN) 500 MG tablet Take 1 tablet (500 mg total) by mouth daily. 10 tablet 0  . mupirocin ointment (BACTROBAN) 2 % Place 1 application into the nose 2 (two) times daily. 22 g 0  . sulfamethoxazole-trimethoprim (BACTRIM DS) 800-160 MG tablet Take 1 tablet by mouth 2 (two) times daily. (Patient not taking: Reported on 09/09/2019) 10 tablet 0  . traMADol (ULTRAM) 50 MG tablet Take by mouth every 6 (six) hours as needed.     No current facility-administered medications for this visit.    Allergies  Allergen Reactions  . Codeine Nausea And Vomiting     REVIEW OF SYSTEMS:   [X]  denotes positive finding, [ ]  denotes negative finding Cardiac  Comments:  Chest pain or chest pressure:    Shortness of breath upon exertion:    Short of breath when lying flat:    Irregular heart rhythm:        Vascular    Pain in calf, thigh, or hip brought on by ambulation:    Pain in feet at night that wakes you up from your sleep:     Blood clot in your veins:    Leg swelling:         Pulmonary    Oxygen at home:    Productive cough:     Wheezing:         Neurologic    Sudden weakness in arms or legs:     Sudden  numbness in arms  or legs:     Sudden onset of difficulty speaking or slurred speech:    Temporary loss of vision in one eye:     Problems with dizziness:         Gastrointestinal    Blood in stool:     Vomited blood:         Genitourinary    Burning when urinating:     Blood in urine:        Psychiatric    Major depression:         Hematologic    Bleeding problems:    Problems with blood clotting too easily:        Skin    Rashes or ulcers:        Constitutional    Fever or chills:      PHYSICAL EXAMINATION:  Vitals:   10/26/19 1403  BP: 126/81  Pulse: 80  Resp: 16  Temp: (!) 97.5 F (36.4 C)  TempSrc: Temporal  SpO2: 97%  Weight: 181 lb 8 oz (82.3 kg)  Height: 5\' 6"  (1.676 m)    General:  WDWN in NAD; vital signs documented above Gait: Not observed HENT: WNL, normocephalic Pulmonary: normal non-labored breathing , without Rales, rhonchi,  wheezing Cardiac: regular HR Abdomen: soft, NT, no masses Skin: without rashes Vascular Exam/Pulses: DP 2+ (normal) 2+ (normal)  PT 1+ (weak) 1+ (weak)   Extremities: without ischemic changes, without Gangrene , without cellulitis; without open wounds;  Musculoskeletal: no muscle wasting or atrophy  Neurologic: A&O X 3;  No focal weakness or paresthesias are detected Psychiatric:  The pt has Normal affect.   Non-Invasive Vascular Imaging:   Negative for DVT Reflux noted in common femoral and femoral vein Reflux noted in mid calf Fluid collection in anterior lateral ankle at the site of trauma    ASSESSMENT/PLAN:: 64 y.o. female here for evaluation of left lower extremity edema  Based on venous reflux study patient has a small fluid collection in the area of her trauma likely representing hematoma She has some deep venous reflux and very minimal superficial reflux; study was also negative for DVT I recommended patient elevate her legs as much as possible during the day and to start wearing knee-high compression;  compression should be worn daily Nothing further to offer from a vascular surgery standpoint Patient will follow-up as needed if symptoms worsen   76, PA-C [facility] [phone]  Clinic MD:   [phone]

## 2019-11-21 ENCOUNTER — Encounter: Payer: Self-pay | Admitting: Family Medicine

## 2019-11-22 ENCOUNTER — Ambulatory Visit (INDEPENDENT_AMBULATORY_CARE_PROVIDER_SITE_OTHER): Payer: Medicare Other | Admitting: Registered Nurse

## 2019-11-22 ENCOUNTER — Other Ambulatory Visit: Payer: Self-pay

## 2019-11-22 ENCOUNTER — Encounter: Payer: Self-pay | Admitting: Registered Nurse

## 2019-11-22 VITALS — BP 161/85 | HR 67 | Temp 97.9°F | Resp 18 | Ht 66.0 in | Wt 178.4 lb

## 2019-11-22 DIAGNOSIS — M5030 Other cervical disc degeneration, unspecified cervical region: Secondary | ICD-10-CM

## 2019-11-22 DIAGNOSIS — Z23 Encounter for immunization: Secondary | ICD-10-CM | POA: Diagnosis not present

## 2019-11-22 MED ORDER — CYCLOBENZAPRINE HCL 5 MG PO TABS: 5.0000 mg | ORAL_TABLET | Freq: Three times a day (TID) | ORAL | 1 refills | Status: DC | PRN

## 2019-11-22 NOTE — Progress Notes (Signed)
Established Patient Office Visit  Subjective:  Patient ID: Renee Jordan, female    DOB: [birth year]  Age: 64 y.o. MRN: [id number]  CC:  Chief Complaint  Patient presents with  . Transitions Of Care    patient states she is here for a transfer of care. Per patient she would like to discuss bp because it usually under 130.    HPI Renee Jordan presents for visit to est care. Pt has been seen before by myself and is fairly well known to me  Histories reviewed, no updates at this time.  Notes that her bp is elevated today - has had some stress about COVID and delta variant recently. Last visits - she has had 5-6 at different providers offices in past few months - have all had BP well wnl. No symptoms today. Does note a significant family history of htn and CV events.   Otherwise no concerns.   Past Medical History:  Diagnosis Date  . Anemia   . Arthritis   . History of chicken pox   . History of measles, mumps, or rubella   . HPV (human papilloma virus) infection   . Infertility, female   . Joint problem   . Shingles   . Thyroid disease   . Uterine polyp   . Yeast infection     Past Surgical History:  Procedure Laterality Date  . BUNIONECTOMY    . DILATION AND CURETTAGE OF UTERUS    . KNEE SURGERY  2010   ACL repair, microfracture  . LAPAROSCOPY     knee  . polyp removal    . WISDOM TOOTH EXTRACTION      Family History  Problem Relation Age of Onset  . Heart disease Brother   . Diabetes Brother   . Hyperlipidemia Brother   . Hypertension Brother   . Hearing loss Brother 59       CAD/PTCA  . Asthma Sister   . Emphysema Mother   . Lung cancer Father 54       lung cancer/prostate cancer  . Prostate cancer Father   . Hypertension Brother   . Stroke Paternal Grandfather   . Stroke Maternal Grandmother   . Heart disease Maternal Grandfather   . Stroke Maternal Grandfather   . Stroke Paternal Grandmother     Social History   Socioeconomic History   . Marital status: Married    Spouse name: Not on file  . Number of children: 1  . Years of education: Not on file  . Highest education level: Not on file  Occupational History  . Not on file  Tobacco Use  . Smoking status: Never Smoker  . Smokeless tobacco: Never Used  Vaping Use  . Vaping Use: Never used  Substance and Sexual Activity  . Alcohol use: No  . Drug use: No  . Sexual activity: Yes    Birth control/protection: Post-menopausal  Other Topics Concern  . Not on file  Social History Narrative   Marital status: married x 24 years      Children: 1 daughter 69; 2 grandchildren (6,11)      Lives: with husband      EMployment: [employer]; full time; happy     Tobacco: never      Alcohol:  None      Exercise: a lot; [employer]; hikes every weekend. 2-3 times per week; hikes a lot; rides bike. Swims weekly.   Social Determinants of Health  Financial Resource Strain:   . Difficulty of Paying Living Expenses: Not on file  Food Insecurity:   . Worried About Programme researcher, broadcasting/film/videounning Out of Food in the Last Year: Not on file  . Ran Out of Food in the Last Year: Not on file  Transportation Needs:   . Lack of Transportation (Medical): Not on file  . Lack of Transportation (Non-Medical): Not on file  Physical Activity:   . Days of Exercise per Week: Not on file  . Minutes of Exercise per Session: Not on file  Stress:   . Feeling of Stress : Not on file  Social Connections:   . Frequency of Communication with Friends and Family: Not on file  . Frequency of Social Gatherings with Friends and Family: Not on file  . Attends Religious Services: Not on file  . Active Member of Clubs or Organizations: Not on file  . Attends BankerClub or Organization Meetings: Not on file  . Marital Status: Not on file  Intimate Partner Violence:   . Fear of Current or Ex-Partner: Not on file  . Emotionally Abused: Not on file  . Physically Abused: Not on file  . Sexually Abused: Not on  file    Outpatient Medications Prior to Visit  Medication Sig Dispense Refill  . acetaminophen (TYLENOL) 500 MG tablet Take 500 mg by mouth every 6 (six) hours as needed.    . diphenhydrAMINE (BENADRYL) 25 MG tablet Take 25 mg by mouth every 6 (six) hours as needed.    Marland Kitchen. levothyroxine (SYNTHROID, LEVOTHROID) 50 MCG tablet Take 50 mcg by mouth daily before breakfast.    . meloxicam (MOBIC) 15 MG tablet Take 1 tablet (15 mg total) by mouth daily. 90 tablet 0  . albuterol (PROVENTIL HFA;VENTOLIN HFA) 108 (90 Base) MCG/ACT inhaler Inhale 2 puffs into the lungs every 6 (six) hours as needed for wheezing or shortness of breath. 1 Inhaler 0  . cephALEXin (KEFLEX) 500 MG capsule Take 1 capsule (500 mg total) by mouth 4 (four) times daily. 40 capsule 0  . cyclobenzaprine (FLEXERIL) 10 MG tablet Take 1 tablet (10 mg total) by mouth 3 (three) times daily as needed for muscle spasms. 90 tablet 1  . doxycycline (VIBRA-TABS) 100 MG tablet Take 1 tablet (100 mg total) by mouth 2 (two) times daily. (Patient not taking: Reported on 09/09/2019) 20 tablet 0  . gabapentin (NEURONTIN) 100 MG capsule Take 1 capsule (100 mg total) by mouth 3 (three) times daily. 45 capsule 0  . ipratropium (ATROVENT) 0.03 % nasal spray Place 2 sprays into both nostrils 2 (two) times daily. 30 mL 0  . levofloxacin (LEVAQUIN) 500 MG tablet Take 1 tablet (500 mg total) by mouth daily. 10 tablet 0  . mupirocin ointment (BACTROBAN) 2 % Place 1 application into the nose 2 (two) times daily. 22 g 0  . sulfamethoxazole-trimethoprim (BACTRIM DS) 800-160 MG tablet Take 1 tablet by mouth 2 (two) times daily. (Patient not taking: Reported on 09/09/2019) 10 tablet 0  . traMADol (ULTRAM) 50 MG tablet Take by mouth every 6 (six) hours as needed.     No facility-administered medications prior to visit.    Allergies  Allergen Reactions  . Codeine Nausea And Vomiting    ROS Review of Systems  Constitutional: Negative.   HENT: Negative.   Eyes:  Negative.   Respiratory: Negative.   Cardiovascular: Negative.   Gastrointestinal: Negative.   Endocrine: Negative.   Genitourinary: Negative.   Musculoskeletal: Negative.   Skin: Negative.  Allergic/Immunologic: Negative.   Neurological: Negative.   Hematological: Negative.   Psychiatric/Behavioral: Negative.   All other systems reviewed and are negative.     Objective:    Physical Exam Vitals and nursing note reviewed.  Constitutional:      Appearance: Normal appearance. She is normal weight.  Cardiovascular:     Rate and Rhythm: Normal rate and regular rhythm.     Heart sounds: Normal heart sounds. No murmur heard.  No friction rub. No gallop.   Pulmonary:     Effort: Pulmonary effort is normal. No respiratory distress.     Breath sounds: Normal breath sounds.  Skin:    General: Skin is warm and dry.     Coloration: Skin is not jaundiced or pale.     Findings: No bruising, erythema, lesion or rash.  Neurological:     Mental Status: She is alert.  Psychiatric:        Mood and Affect: Mood normal.        Behavior: Behavior normal.        Thought Content: Thought content normal.        Judgment: Judgment normal.     BP (!) 161/85   Pulse 67   Temp 97.9 F (36.6 C) (Temporal)   Resp 18   Ht 5\' 6"  (1.676 m)   Wt 178 lb 6.4 oz (80.9 kg)   SpO2 95%   BMI 28.79 kg/m  Wt Readings from Last 3 Encounters:  11/22/19 178 lb 6.4 oz (80.9 kg)  10/26/19 181 lb 8 oz (82.3 kg)  09/09/19 181 lb 3.2 oz (82.2 kg)     There are no preventive care reminders to display for this patient.  There are no preventive care reminders to display for this patient.  Lab Results  Component Value Date   TSH 2.370 09/09/2019   Lab Results  Component Value Date   WBC 5.8 09/09/2019   HGB 14.3 09/09/2019   HCT 42.9 09/09/2019   MCV 92 09/09/2019   PLT 286 11/27/2017   Lab Results  Component Value Date   NA 139 09/09/2019   K 4.7 09/09/2019   CO2 21 09/09/2019   GLUCOSE 91  09/09/2019   BUN 20 09/09/2019   CREATININE 1.02 (H) 09/09/2019   BILITOT 0.3 09/09/2019   ALKPHOS 75 09/09/2019   AST 19 09/09/2019   ALT 18 09/09/2019   PROT 6.6 09/09/2019   ALBUMIN 4.3 09/09/2019   CALCIUM 9.6 09/09/2019   Lab Results  Component Value Date   CHOL 327 (H) 09/09/2019   Lab Results  Component Value Date   HDL 57 09/09/2019   Lab Results  Component Value Date   LDLCALC 224 (H) 09/09/2019   Lab Results  Component Value Date   TRIG 229 (H) 09/09/2019   Lab Results  Component Value Date   CHOLHDL 5.7 (H) 09/09/2019   Lab Results  Component Value Date   HGBA1C 5.6 01/30/2017      Assessment & Plan:   Problem List Items Addressed This Visit      Musculoskeletal and Integument   Degenerative disc disease, cervical   Relevant Medications   cyclobenzaprine (FLEXERIL) 5 MG tablet    Other Visit Diagnoses    Need for prophylactic vaccination and inoculation against influenza    -  Primary   Relevant Orders   Flu Vaccine QUAD 6+ mos PF IM (Fluarix Quad PF) (Completed)      Meds ordered this encounter  Medications  .  cyclobenzaprine (FLEXERIL) 5 MG tablet    Sig: Take 1 tablet (5 mg total) by mouth 3 (three) times daily as needed for muscle spasms.    Dispense:  30 tablet    Refill:  1    Order Specific Question:   Supervising Provider    Answer:   Neva Seat, JEFFREY R [[id number]]    Follow-up: No follow-ups on file.   PLAN  Refill flexeril for C spine spasm  Flu vaccine given today  Will return for nurse only visit HTN check in 2-3 weeks, anticipating this will return to wnl  Patient encouraged to call clinic with any questions, comments, or concerns.  Janeece Agee, NP

## 2019-12-06 ENCOUNTER — Other Ambulatory Visit: Payer: Self-pay

## 2019-12-06 ENCOUNTER — Ambulatory Visit: Payer: Medicare Other | Admitting: Registered Nurse

## 2019-12-06 VITALS — BP 139/76

## 2019-12-06 DIAGNOSIS — I10 Essential (primary) hypertension: Secondary | ICD-10-CM

## 2020-03-06 ENCOUNTER — Other Ambulatory Visit: Payer: Self-pay | Admitting: Endocrinology

## 2020-03-06 DIAGNOSIS — E049 Nontoxic goiter, unspecified: Secondary | ICD-10-CM

## 2020-09-12 ENCOUNTER — Ambulatory Visit (INDEPENDENT_AMBULATORY_CARE_PROVIDER_SITE_OTHER): Payer: Medicare Other | Admitting: Internal Medicine

## 2020-09-12 ENCOUNTER — Other Ambulatory Visit: Payer: Self-pay

## 2020-09-12 ENCOUNTER — Encounter: Payer: Self-pay | Admitting: Internal Medicine

## 2020-09-12 VITALS — BP 120/88 | HR 71 | Temp 98.6°F | Ht 66.0 in | Wt 181.0 lb

## 2020-09-12 DIAGNOSIS — M8949 Other hypertrophic osteoarthropathy, multiple sites: Secondary | ICD-10-CM | POA: Diagnosis not present

## 2020-09-12 DIAGNOSIS — Z1211 Encounter for screening for malignant neoplasm of colon: Secondary | ICD-10-CM

## 2020-09-12 DIAGNOSIS — M159 Polyosteoarthritis, unspecified: Secondary | ICD-10-CM

## 2020-09-12 DIAGNOSIS — Z13220 Encounter for screening for lipoid disorders: Secondary | ICD-10-CM

## 2020-09-12 DIAGNOSIS — M5030 Other cervical disc degeneration, unspecified cervical region: Secondary | ICD-10-CM

## 2020-09-12 DIAGNOSIS — E049 Nontoxic goiter, unspecified: Secondary | ICD-10-CM

## 2020-09-12 LAB — COMPREHENSIVE METABOLIC PANEL
ALT: 16 U/L (ref 0–35)
AST: 19 U/L (ref 0–37)
Albumin: 4.1 g/dL (ref 3.5–5.2)
Alkaline Phosphatase: 54 U/L (ref 39–117)
BUN: 20 mg/dL (ref 6–23)
CO2: 29 mEq/L (ref 19–32)
Calcium: 9.6 mg/dL (ref 8.4–10.5)
Chloride: 103 mEq/L (ref 96–112)
Creatinine, Ser: 0.97 mg/dL (ref 0.40–1.20)
GFR: 60.88 mL/min (ref >60.00)
Glucose, Bld: 92 mg/dL (ref 70–99)
Potassium: 3.8 mEq/L (ref 3.5–5.1)
Sodium: 140 mEq/L (ref 135–145)
Total Bilirubin: 0.4 mg/dL (ref 0.2–1.2)
Total Protein: 6.8 g/dL (ref 6.0–8.3)

## 2020-09-12 LAB — LIPID PANEL
Cholesterol: 294 mg/dL — ABNORMAL HIGH (ref 0–200)
HDL: 61.9 mg/dL (ref >39.00)
LDL Cholesterol: 195 mg/dL — ABNORMAL HIGH (ref 0–99)
NonHDL: 231.68
Total CHOL/HDL Ratio: 5
Triglycerides: 184 mg/dL — ABNORMAL HIGH (ref 0.0–149.0)
VLDL: 36.8 mg/dL (ref 0.0–40.0)

## 2020-09-12 LAB — CBC
HCT: 41.8 % (ref 36.0–46.0)
Hemoglobin: 14.4 g/dL (ref 12.0–15.0)
MCHC: 34.5 g/dL (ref 30.0–36.0)
MCV: 90 fl (ref 78.0–100.0)
Platelets: 279 10*3/uL (ref 150.0–400.0)
RBC: 4.64 Mil/uL (ref 3.87–5.11)
RDW: 13 % (ref 11.5–15.5)
WBC: 6.2 10*3/uL (ref 4.0–10.5)

## 2020-09-12 NOTE — Patient Instructions (Signed)
We will check the labs today and refill the medications.

## 2020-09-12 NOTE — Progress Notes (Signed)
   Subjective:   Patient ID: Renee Jordan, female    DOB: 1955/09/23, 65 y.o.   MRN: [id number]  HPI The patient is a new 65 YO female coming in for thyroid/goiter (taking synthroid 50 mcg daily, no recent dose changes) and neck DJD (takes meloxicam and flexeril as needed, only for flares, does keep up with PT exercises which seems to reduce flares, denies current problems with this).  PMH, [facility], social history reviewed and updated  Review of Systems  Constitutional: Negative.   HENT: Negative.    Eyes: Negative.   Respiratory:  Negative for cough, chest tightness and shortness of breath.   Cardiovascular:  Negative for chest pain, palpitations and leg swelling.  Gastrointestinal:  Negative for abdominal distention, abdominal pain, constipation, diarrhea, nausea and vomiting.  Musculoskeletal: Negative.   Skin: Negative.   Neurological: Negative.   Psychiatric/Behavioral: Negative.     Objective:  Physical Exam Constitutional:      Appearance: She is well-developed.  HENT:     Head: Normocephalic and atraumatic.  Cardiovascular:     Rate and Rhythm: Normal rate and regular rhythm.  Pulmonary:     Effort: Pulmonary effort is normal. No respiratory distress.     Breath sounds: Normal breath sounds. No wheezing or rales.  Abdominal:     General: Bowel sounds are normal. There is no distension.     Palpations: Abdomen is soft.     Tenderness: There is no abdominal tenderness. There is no rebound.  Musculoskeletal:     Cervical back: Normal range of motion.  Skin:    General: Skin is warm and dry.  Neurological:     Mental Status: She is alert and oriented to person, place, and time.     Coordination: Coordination normal.    Vitals:   09/12/20 0846  BP: 120/88  Pulse: 71  Temp: 98.6 F (37 C)  TempSrc: Oral  SpO2: 96%  Weight: 181 lb (82.1 kg)  Height: 5\' 6"  (1.676 m)    This visit occurred during the SARS-CoV-2 public health emergency.  Safety protocols were  in place, including screening questions prior to the visit, additional usage of staff PPE, and extensive cleaning of exam room while observing appropriate contact time as indicated for disinfecting solutions.   Assessment & Plan:

## 2020-09-14 ENCOUNTER — Encounter: Payer: Self-pay | Admitting: Internal Medicine

## 2020-09-14 DIAGNOSIS — M5030 Other cervical disc degeneration, unspecified cervical region: Secondary | ICD-10-CM

## 2020-09-14 NOTE — Assessment & Plan Note (Signed)
Can refill meloxicam and flexeril as needed (or she may let us know a different muscle relaxer covered by insurance).

## 2020-09-14 NOTE — Assessment & Plan Note (Signed)
Dr. Talmage Nap does treat her goiter and she is on suppressive 50 mcg daily synthroid.

## 2020-09-14 NOTE — Assessment & Plan Note (Signed)
Various knee surgeries and hoping to avoid knee replacements. She did stop running which has helped overall reduce pain.

## 2020-09-18 MED ORDER — CYCLOBENZAPRINE HCL 5 MG PO TABS: 5.0000 mg | ORAL_TABLET | Freq: Three times a day (TID) | ORAL | 0 refills | Status: AC | PRN

## 2020-10-05 LAB — COLOGUARD: Cologuard: NEGATIVE

## 2020-10-10 LAB — COLOGUARD: COLOGUARD: NEGATIVE

## 2020-11-05 ENCOUNTER — Ambulatory Visit (INDEPENDENT_AMBULATORY_CARE_PROVIDER_SITE_OTHER): Payer: Medicare Other | Admitting: Internal Medicine

## 2020-11-05 ENCOUNTER — Encounter: Payer: Self-pay | Admitting: Internal Medicine

## 2020-11-05 ENCOUNTER — Other Ambulatory Visit: Payer: Self-pay

## 2020-11-05 VITALS — BP 130/76 | HR 54 | Temp 98.6°F | Ht 66.0 in | Wt 182.0 lb

## 2020-11-05 DIAGNOSIS — B86 Scabies: Secondary | ICD-10-CM | POA: Insufficient documentation

## 2020-11-05 MED ORDER — PERMETHRIN 5 % EX CREA: 1.0000 "application " | TOPICAL_CREAM | Freq: Once | CUTANEOUS | 1 refills | Status: AC

## 2020-11-05 NOTE — Progress Notes (Signed)
Subjective:    Patient ID: Renee Jordan, female    DOB: [birth year], 65 y.o.   MRN: [id number]  HPI The patient is here for an acute visit.  ? Scabies - rash started noon on Saturday.  Rash is getting worse.  The rash is everywhere - legs, arms, back, abdomen.  She did see a very small mite.  Itching is worse at night and when she is hot and sweaty.  She can feel something crawling on her and biting her.    Her husband is symptomatic as well - she thinks or it could be sympathetic itching.   She denies other symptoms    Medications and allergies reviewed with patient and updated if appropriate.  Patient Active Problem List   Diagnosis Date Noted   Abnormal cervical Papanicolaou smear 02/13/2017   Degenerative disc disease, cervical 01/26/2014   Osteoarthritis 01/26/2014   Goiter 01/16/2012    Current Outpatient Medications on File Prior to Visit  Medication Sig Dispense Refill   acetaminophen (TYLENOL) 500 MG tablet Take 500 mg by mouth every 6 (six) hours as needed.     cyclobenzaprine (FLEXERIL) 5 MG tablet Take 1 tablet (5 mg total) by mouth 3 (three) times daily as needed for muscle spasms. 90 tablet 0   diphenhydrAMINE (BENADRYL) 25 MG tablet Take 25 mg by mouth every 6 (six) hours as needed.     levothyroxine (SYNTHROID, LEVOTHROID) 50 MCG tablet Take 50 mcg by mouth daily before breakfast.     meloxicam (MOBIC) 15 MG tablet Take 1 tablet (15 mg total) by mouth daily. 90 tablet 0   No current facility-administered medications on file prior to visit.    Past Medical History:  Diagnosis Date   Anemia    Arthritis    History of chicken pox    History of measles, mumps, or rubella    HPV (human papilloma virus) infection    Infertility, female    Joint problem    Shingles    Thyroid disease    Uterine polyp    Yeast infection     Past Surgical History:  Procedure Laterality Date   BUNIONECTOMY     DILATION AND CURETTAGE OF UTERUS     KNEE SURGERY  2010    ACL repair, microfracture   LAPAROSCOPY     knee   polyp removal     WISDOM TOOTH EXTRACTION      Social History   Socioeconomic History   Marital status: Married    Spouse name: Not on file   Number of children: 1   Years of education: Not on file   Highest education level: Not on file  Occupational History   Not on file  Tobacco Use   Smoking status: Never   Smokeless tobacco: Never  Vaping Use   Vaping Use: Never used  Substance and Sexual Activity   Alcohol use: No   Drug use: No   Sexual activity: Yes    Birth control/protection: Post-menopausal  Other Topics Concern   Not on file  Social History Narrative   Marital status: married x 24 years      Children: 1 daughter 19; 2 grandchildren (57,11)      Lives: with husband      EMployment: [employer]; full time; happy     Tobacco: never      Alcohol:  None      Exercise: a lot; [employer]; hikes every weekend. 2-3 times per  week; hikes a lot; rides bike. Swims weekly.   Social Determinants of Health   Financial Resource Strain: Not on file  Food Insecurity: Not on file  Transportation Needs: Not on file  Physical Activity: Not on file  Stress: Not on file  Social Connections: Not on file    Family History  Problem Relation Age of Onset   Heart disease Brother    Diabetes Brother    Hyperlipidemia Brother    Hypertension Brother    Hearing loss Brother 24       CAD/PTCA   Asthma Sister    Emphysema Mother    Lung cancer Father 75       lung cancer/prostate cancer   Prostate cancer Father    Hypertension Brother    Stroke Paternal Grandfather    Stroke Maternal Grandmother    Heart disease Maternal Grandfather    Stroke Maternal Grandfather    Stroke Paternal Grandmother     Review of Systems     Objective:   Vitals:   11/05/20 1308  BP: 130/76  Pulse: (!) 54  Temp: 98.6 F (37 C)  SpO2: 96%   BP Readings from Last 3 Encounters:  11/05/20 130/76  09/12/20  120/88  12/06/19 139/76   Wt Readings from Last 3 Encounters:  11/05/20 182 lb (82.6 kg)  09/12/20 181 lb (82.1 kg)  11/22/19 178 lb 6.4 oz (80.9 kg)   Body mass index is 29.38 kg/m.   Physical Exam Constitutional:      General: She is not in acute distress.    Appearance: Normal appearance. She is not ill-appearing.  Skin:    General: Skin is warm and dry.     Findings: Rash (macular papular rash - some scabbed over - arms, legs - did not exam torso) present.  Neurological:     Mental Status: She is alert.           Assessment & Plan:    See Problem List for Assessment and Plan of chronic medical problems.    This visit occurred during the SARS-CoV-2 public health emergency.  Safety protocols were in place, including screening questions prior to the visit, additional usage of staff PPE, and extensive cleaning of exam room while observing appropriate contact time as indicated for disinfecting solutions.

## 2020-11-05 NOTE — Patient Instructions (Addendum)
Permethrin cream as instructed.    Scabies, Adult  Scabies is a skin condition that happens when very small insects called mites get under the skin (infestation). This causes a rash and severe itchiness. Scabies is contagious, which means it can spread from person to person. If you get scabies, it is common forothers in your household to get scabies too. With proper treatment, symptoms usually go away in 2-4 weeks. Scabies usuallydoes not cause lasting problems. What are the causes? This condition is caused by tiny mites (Sarcoptes scabiei, or human itch mites) that can only be seen with a microscope. The mites get into the top layer of skin and lay eggs. Scabies can spread from person to person through: Close contact with a person who has scabies. Sharing or having contact with infested items, such as towels, bedding, or clothing. What increases the risk? The following factors may make you more likely to develop this condition: Living in a nursing home or other extended care facility. Having sexual contact with a partner who has scabies. Caring for others who are at increased risk for scabies. What are the signs or symptoms? Symptoms of this condition include: Severe itchiness. This is often worse at night. A rash that includes tiny red bumps or blisters. The rash commonly occurs on the hands, wrists, elbows, armpits, chest, waist, groin, or buttocks. The bumps may form a line (burrow) in some areas. Skin irritation. This can include scaly patches or sores. How is this diagnosed? This condition may be diagnosed based on: A physical exam of the skin. A skin test. Your health care provider may take a sample of your affected skin (skin scraping) and have it examined under a microscope for signs of mites. How is this treated? This condition may be treated with: Medicated cream or lotion that kills the mites. This is spread on the entire body and left on for several hours. Usually, one  treatment with medicated cream or lotion is enough to kill all the mites. In severe cases, the treatment may need to be repeated. Medicated cream that relieves itching. Medicines taken by mouth (orally) that: Relieve itching. Reduce the swelling and redness. Kill the mites. This treatment may be done in severe cases. Follow these instructions at home: Medicines Take or apply over-the-counter and prescription medicines only as told by your health care provider. Apply medicated cream or lotion as told by your health care provider. Do not wash off the medicated cream or lotion until the necessary amount of time has passed. Skin care Avoid scratching the affected areas of your skin. Keep your fingernails closely trimmed to reduce injury from scratching. Take cool baths or apply cool washcloths to your skin to help reduce itching. General instructions Clean all items that you had contact with during the 3 days before diagnosis. This includes bedding, clothing, towels, and furniture. Do this on the same day that you start treatment. Dry-clean items, or use hot water to wash items. Dry items on the hot dry cycle. Place items that cannot be washed into closed, airtight plastic bags for at least 3 days. The mites cannot live for more than 3 days away from human skin. Vacuum furniture and mattresses that you use. Make sure that other people who may have been infested are examined by a health care provider. These include members of your household and anyone who may have had contact with infested items. Keep all follow-up visits. This is important. Where to find more information Centers for Disease  Control and Prevention: [employer] Contact a health care provider if: You have itching that does not go away after 4 weeks of treatment. You continue to develop new bumps or burrows. You have redness, swelling, or pain in your rash area after treatment. You have fluid, blood, or pus coming from your  rash. Summary Scabies is a skin condition that causes a rash and severe itchiness. This condition is caused by tiny mites that get into the top layer of the skin and lay eggs. Scabies can spread from person to person. Follow treatments as recommended by your health care provider. Clean all items that you recently had contact with. This information is not intended to replace advice given to you by your health care provider. Make sure you discuss any questions you have with your healthcare provider. Document Revised: 07/15/2019 Document Reviewed: 07/15/2019 Elsevier Patient Education  2022 [employer].

## 2020-11-05 NOTE — Assessment & Plan Note (Signed)
Acute Rash and symptoms very concerning for scabies - she did see a mite on her permethrin cream today - advised to put from neck to soles and keep on for at least 8 hrs, repeat in 1-2 weeks She will call if no improvement Continue benadryl for itch Her husband will be treated by his doctor - has appt today

## 2021-02-27 ENCOUNTER — Ambulatory Visit
Admission: RE | Admit: 2021-02-27 | Discharge: 2021-02-27 | Disposition: A | Discharge status: Home / Self Care | Payer: Medicare Other | Source: Ambulatory Visit | Attending: Endocrinology | Admitting: Endocrinology

## 2021-02-27 DIAGNOSIS — E049 Nontoxic goiter, unspecified: Secondary | ICD-10-CM

## 2021-06-30 ENCOUNTER — Encounter: Payer: Self-pay | Admitting: Registered Nurse

## 2021-07-15 ENCOUNTER — Encounter: Payer: Self-pay | Admitting: Internal Medicine

## 2021-07-22 ENCOUNTER — Encounter: Payer: Self-pay | Admitting: Internal Medicine

## 2021-07-22 ENCOUNTER — Ambulatory Visit (INDEPENDENT_AMBULATORY_CARE_PROVIDER_SITE_OTHER): Payer: Medicare Other | Admitting: Internal Medicine

## 2021-07-22 DIAGNOSIS — R21 Rash and other nonspecific skin eruption: Secondary | ICD-10-CM | POA: Diagnosis not present

## 2021-07-22 MED ORDER — TRIAMCINOLONE ACETONIDE 0.1 % EX CREA: 1.0000 "application " | TOPICAL_CREAM | Freq: Two times a day (BID) | CUTANEOUS | 0 refills | Status: DC

## 2021-07-22 NOTE — Patient Instructions (Signed)
We have sent in the triamcinolone ointment to use twice a day for 1-2 weeks. ? ? ?

## 2021-07-22 NOTE — Assessment & Plan Note (Signed)
Cancerous lesion ruled out. Likely eczema/contact dermatitis. Rx triamcinolone ointment to use BID for 1-2 weeks. If no response will add anti-fungal.  ?

## 2021-07-22 NOTE — Progress Notes (Signed)
? ?  Subjective:  ? ?Patient ID: Renee Jordan, female    DOB: 08-13-1955, 66 y.o.   MRN: [id number] ? ?HPI ?The patient is a 66 YO female coming in for lesion on her back. Itchy present 2 months. ? ?Review of Systems  ?Constitutional: Negative.   ?HENT: Negative.    ?Eyes: Negative.   ?Respiratory:  Negative for cough, chest tightness and shortness of breath.   ?Cardiovascular:  Negative for chest pain, palpitations and leg swelling.  ?Gastrointestinal:  Negative for abdominal distention, abdominal pain, constipation, diarrhea, nausea and vomiting.  ?Musculoskeletal: Negative.   ?Skin:  Positive for rash.  ?Neurological: Negative.   ?Psychiatric/Behavioral: Negative.    ? ?Objective:  ?Physical Exam ?Constitutional:   ?   Appearance: Normal appearance.  ?HENT:  ?   Head: Normocephalic.  ?Cardiovascular:  ?   Rate and Rhythm: Normal rate.  ?Pulmonary:  ?   Effort: Pulmonary effort is normal.  ?Musculoskeletal:     ?   General: Normal range of motion.  ?Skin: ?   General: Skin is warm and dry.  ?   Findings: Rash present.  ?   Comments: Rash consistent with eczema middle back about 1 cm circular  ?Neurological:  ?   General: No focal deficit present.  ?   Mental Status: She is alert and oriented to person, place, and time.  ? ? ?Vitals:  ? 07/22/21 0817  ?BP: 122/82  ?Pulse: 62  ?Resp: 18  ?SpO2: 96%  ?Weight: 186 lb (84.4 kg)  ?Height: 5\' 6"  (1.676 m)  ? ? ?This visit occurred during the SARS-CoV-2 public health emergency.  Safety protocols were in place, including screening questions prior to the visit, additional usage of staff PPE, and extensive cleaning of exam room while observing appropriate contact time as indicated for disinfecting solutions.  ? ?Assessment & Plan:  ? ?

## 2021-11-17 ENCOUNTER — Telehealth: Payer: Medicare Other | Admitting: Family

## 2021-11-17 DIAGNOSIS — B86 Scabies: Secondary | ICD-10-CM

## 2021-11-17 DIAGNOSIS — R21 Rash and other nonspecific skin eruption: Secondary | ICD-10-CM

## 2021-11-17 MED ORDER — PERMETHRIN 5 % EX CREA: 1.0000 | TOPICAL_CREAM | Freq: Once | CUTANEOUS | 1 refills | Status: AC

## 2021-11-17 NOTE — Progress Notes (Signed)
E-Visit for Scabies  We are sorry that you are not feeling well. Here is how we plan to help!  Based on what you shared with me it looks like you may have scabies.  The rash does not look exactly like scabies, however, we will treat. Scabies is an infection caused by very tiny mites that burrow into the skin.  They cause severe itching. Though children are most commonly infected, anyone can get scabies.  Scabies mites can pass from person to person through physical close contact.  They can also be passed through shared clothing, towels, and bedding.  Scabies infection is not usually dangerous, but it is uncomfortable.  Because it is so contagious, scabies should be treated immediately to keep the infection from spreading.  If you have children in day care, please have any exposed children examined by their pediatrician. .   I have prescribed Permethrin topical cream 5% thoroughly massage cream from head to soles of feet; leave on for 8 to 14 hours before removing (shower or bath). May repeat if living mites are observed 14 days after first treatment; one application is generally curative.    HOME CARE:  You should treat all members in your household whether they show symptoms or not. Wash towels, clothes, bed linens, cloth toys, hats, and other personal items in hot water and dry on high heat. Vacuum floors and furniture and throw away the bag afterwards. Keep your child home from day care or school until the morning after treatment for scabies. Notify your child's day care or school so that other children can be checked and treated.  GET HELP RIGHT AWAY IF:  The infected person has a fever, red streaks, pain, or swelling of the skin. Sores get worse or don't heal. New rashes appear or itching continues for more than 2 weeks after treatment.  MAKE SURE YOU:  Understand these instructions. Will watch your condition. Will get help right away if you are not doing well or get worse.  Thank  you for choosing an e-visit.  Your e-visit answers were reviewed by a board certified advanced clinical practitioner to complete your personal care plan. Depending upon the condition, your plan could have included both over the counter or prescription medications.  Please review your pharmacy choice. Make sure the pharmacy is open so you can pick up prescription now. If there is a problem, you may contact your provider through [employer] and have the prescription routed to another pharmacy.  Your safety is important to [place]. If you have drug allergies check your prescription carefully.   For the next 24 hours you can use MyChart to ask questions about today's visit, request a non-urgent call back, or ask for a work or school excuse. You will get an email in the next two days asking about your experience. I hope that your e-visit has been valuable and will speed your recovery.  Approximately 5 minutes was spent documenting and reviewing patient's chart.

## 2022-01-01 NOTE — Progress Notes (Signed)
Subjective:   Renee Jordan is a 66 y.o. female who presents for an Initial Medicare Annual Wellness Visit. I connected with  Lona Kettle on 01/02/22 by a audio enabled telemedicine application and verified that I am speaking with the correct person using two identifiers.  Patient Location: Home  Provider Location: Home Office  I discussed the limitations of evaluation and management by telemedicine. The patient expressed understanding and agreed to proceed.  Review of Systems    Deferred to PCP       Objective:    There were no vitals filed for this visit. There is no height or weight on file to calculate BMI.     01/02/2022    9:27 AM  Advanced Directives  Does Patient Have a Medical Advance Directive? Yes  Type of Paramedic of [place];Living will  Does patient want to make changes to medical advance directive? No - Patient declined  Copy of [place] in Chart? No - copy requested    Current Medications (verified) Outpatient Encounter Medications as of 01/02/2022  Medication Sig   acetaminophen (TYLENOL) 500 MG tablet Take 500 mg by mouth every 6 (six) hours as needed.   cyclobenzaprine (FLEXERIL) 5 MG tablet Take 1 tablet (5 mg total) by mouth 3 (three) times daily as needed for muscle spasms. (Patient taking differently: Take 5 mg by mouth as needed for muscle spasms.)   levothyroxine (SYNTHROID, LEVOTHROID) 50 MCG tablet Take 50 mcg by mouth daily before breakfast.   triamcinolone cream (KENALOG) 0.1 % Apply 1 application. topically 2 (two) times daily.   No facility-administered encounter medications on file as of 01/02/2022.    Allergies (verified) Codeine   History: Past Medical History:  Diagnosis Date   Anemia    Arthritis    History of chicken pox    History of measles, mumps, or rubella    HPV (human papilloma virus) infection    Infertility, female    Joint problem    Shingles    Thyroid  disease    Uterine polyp    Yeast infection    Past Surgical History:  Procedure Laterality Date   BUNIONECTOMY     DILATION AND CURETTAGE OF UTERUS     KNEE SURGERY  2010   ACL repair, microfracture   LAPAROSCOPY     knee   polyp removal     WISDOM TOOTH EXTRACTION     Family History  Problem Relation Age of Onset   Heart disease Brother    Diabetes Brother    Hyperlipidemia Brother    Hypertension Brother    Hearing loss Brother 23       CAD/PTCA   Asthma Sister    Emphysema Mother    Lung cancer Father 42       lung cancer/prostate cancer   Prostate cancer Father    Hypertension Brother    Stroke Paternal Grandfather    Stroke Maternal Grandmother    Heart disease Maternal Grandfather    Stroke Maternal Grandfather    Stroke Paternal Grandmother    Social History   Socioeconomic History   Marital status: Married    Spouse name: Annie Main   Number of children: 1   Years of education: college   Highest education level: Not on file  Occupational History   Not on file  Tobacco Use   Smoking status: Never   Smokeless tobacco: Never  Vaping Use   Vaping Use: Never used  Substance and Sexual Activity   Alcohol use: No   Drug use: No   Sexual activity: Yes    Birth control/protection: Post-menopausal  Other Topics Concern   Not on file  Social History Narrative   Marital status: married x 24 years      Children: 1 daughter 42; 2 grandchildren (50,11)      Lives: with husband      EMployment: [employer]; full time; happy     Tobacco: never      Alcohol:  None      Exercise: a lot; [employer]; hikes every weekend. 2-3 times per week; hikes a lot; rides bike. Swims weekly.   Social Determinants of Health   Financial Resource Strain: Low Risk  (01/02/2022)   Overall Financial Resource Strain (CARDIA)    Difficulty of Paying Living Expenses: Not hard at all  Food Insecurity: No Food Insecurity (01/02/2022)   Hunger Vital Sign     Worried About Running Out of Food in the Last Year: Never true    Ran Out of Food in the Last Year: Never true  Transportation Needs: No Transportation Needs (01/02/2022)   PRAPARE - Hydrologist (Medical): No    Lack of Transportation (Non-Medical): No  Physical Activity: Sufficiently Active (01/02/2022)   Exercise Vital Sign    Days of Exercise per Week: 5 days    Minutes of Exercise per Session: 60 min  Stress: No Stress Concern Present (01/02/2022)   [place]    Feeling of Stress : Only a little  Social Connections: Unknown (01/02/2022)   Social Connection and Isolation Panel [NHANES]    Frequency of Communication with Friends and Family: More than three times a week    Frequency of Social Gatherings with Friends and Family: Three times a week    Attends Religious Services: Not on file    Active Member of Clubs or Organizations: Yes    Attends Archivist Meetings: More than 4 times per year    Marital Status: Married    Tobacco Counseling Counseling given: Not Answered   Clinical Intake:  Pre-visit preparation completed: Yes  Pain : No/denies pain     Nutritional Status: BMI > 30  Obese Nutritional Risks: None Diabetes: No  How often do you need to have someone help you when you read instructions, pamphlets, or other written materials from your doctor or pharmacy?: 1 - Never What is the last grade level you completed in school?: college  Diabetic?No  Interpreter Needed?: No  Information entered by :: Emelia Loron RN   Activities of Daily Living    01/02/2022    9:25 AM 01/02/2022    6:59 AM  In your present state of health, do you have any difficulty performing the following activities:  Hearing? 0 0  Vision? 0 0  Difficulty concentrating or making decisions? 0 0  Walking or climbing stairs? 0 0  Dressing or bathing? 0 0  Doing errands, shopping? 0 0   Preparing Food and eating ? N N  Using the Toilet? N N  In the past six months, have you accidently leaked urine? N N  Do you have problems with loss of bowel control? N N  Managing your Medications? N N  Managing your Finances? N N  Housekeeping or managing your Housekeeping? N N    Patient Care Team: Hoyt Koch, MD as PCP - General (  Internal Medicine) Donnel Saxon, CNM as Midwife (Obstetrics and Gynecology) Jacelyn Pi, MD as Consulting Physician (Endocrinology)  Indicate any recent Medical Services you may have received from other than [facility] providers in the past year (date may be approximate).     Assessment:   This is a routine wellness examination for Renee Jordan.  Hearing/Vision screen No results found.  Dietary issues and exercise activities discussed: Current Exercise Habits: Home exercise routine, Type of exercise: walking, Time (Minutes): 30, Frequency (Times/Week): 4, Weekly Exercise (Minutes/Week): 120, Intensity: Moderate, Exercise limited by: orthopedic condition(s)   Goals Addressed             This Visit's Progress    Patient Stated       Maintain my independence; continue to be physically active.      Depression Screen    09/12/2020    8:50 AM 11/22/2019    3:58 PM 09/09/2019   11:41 AM 09/02/2019    9:42 AM 08/24/2019   10:12 AM 11/27/2017    8:05 AM 03/20/2017    4:19 PM  PHQ 2/9 Scores  PHQ - 2 Score 0 0 0 0 0 0 0    Fall Risk    01/02/2022    6:59 AM 09/12/2020    8:50 AM 11/22/2019    3:58 PM 09/09/2019   11:41 AM 09/02/2019    9:42 AM  [place] in the past year? 0 1 0 0 0  Number falls in past yr: 0 0 0 0 0  Injury with Fall? 0 0 0 0 0  Risk for fall due to :  No Fall Risks     Follow up  Falls evaluation completed Falls evaluation completed Falls evaluation completed Falls evaluation completed    [place]:  Any stairs in or around the home? Yes  If so, are there any without  handrails? Yes  Home free of loose throw rugs in walkways, pet beds, electrical cords, etc? Yes  Adequate lighting in your home to reduce risk of falls? Yes   ASSISTIVE DEVICES UTILIZED TO PREVENT FALLS:  Life alert? No  Use of a cane, walker or w/c? No  Grab bars in the bathroom? Yes  Shower chair or bench in shower? No  Elevated toilet seat or a handicapped toilet? No   Cognitive Function:        01/02/2022    9:28 AM  6CIT Screen  What Year? 0 points  What month? 0 points  What time? 0 points  Count back from 20 0 points  Months in reverse 0 points  Repeat phrase 0 points  Total Score 0 points    Immunizations Immunization History  Administered Date(s) Administered   Influenza,inj,Quad PF,6+ Mos 01/30/2017, 11/27/2017, 11/22/2019   Influenza-Unspecified 01/13/2021   PFIZER(Purple Top)SARS-COV-2 Vaccination 05/09/2019, 06/03/2019   Td 09/04/2005   Tdap 01/30/2017, 08/24/2019    TDAP status: Up to date  Flu Vaccine status: Due, Education has been provided regarding the importance of this vaccine. Advised may receive this vaccine at local pharmacy or Health Dept. Aware to provide a copy of the vaccination record if obtained from local pharmacy or Health Dept. Verbalized acceptance and understanding.  Pneumococcal vaccine status: Due, Education has been provided regarding the importance of this vaccine. Advised may receive this vaccine at local pharmacy or Health Dept. Aware to provide a copy of the vaccination record if obtained from local pharmacy or Health Dept. Verbalized acceptance and understanding.  Covid-19 vaccine status: Information provided on how to obtain vaccines.   Qualifies for Shingles Vaccine? Yes   Zostavax completed No   Shingrix Completed?: No.    Education has been provided regarding the importance of this vaccine. Patient has been advised to call insurance company to determine out of pocket expense if they have not yet received this vaccine.  Advised may also receive vaccine at local pharmacy or Health Dept. Verbalized acceptance and understanding.  Screening Tests Health Maintenance  Topic Date Due   Zoster Vaccines- Shingrix (1 of 2) Never done   Pneumonia Vaccine 22+ Years old (1 - PCV) Never done   DEXA SCAN  02/25/2019   COVID-19 Vaccine (4 - Pfizer series) 08/13/2019   INFLUENZA VACCINE  10/29/2021   MAMMOGRAM  02/28/2022   Fecal DNA (Cologuard)  10/06/2023   TETANUS/TDAP  08/23/2029   Hepatitis C Screening  Completed   HPV VACCINES  Aged Out    Health Maintenance  Health Maintenance Due  Topic Date Due   Zoster Vaccines- Shingrix (1 of 2) Never done   Pneumonia Vaccine 53+ Years old (1 - PCV) Never done   DEXA SCAN  02/25/2019   COVID-19 Vaccine (4 - Pfizer series) 08/13/2019   INFLUENZA VACCINE  10/29/2021    Colorectal cancer screening: Type of screening: Cologuard. Completed 10/05/20. Repeat every 3 years  Mammogram status: Completed 02/29/20. Repeat every year; Patient reports she had a mammogram through her GYN 2022 and insurance will cover the procedure every 2 years  Bone Density: Patient states she will discuss with PCP  Lung Cancer Screening: (Low Dose CT Chest recommended if Age 39-80 years, 30 pack-year currently smoking OR have quit w/in 15years.) does not qualify.   Additional Screening:  Hepatitis C Screening: does qualify; Completed 01/30/17  Vision Screening: Recommended annual ophthalmology exams for early detection of glaucoma and other disorders of the eye. Is the patient up to date with their annual eye exam?  No  Who is the provider or what is the name of the office in which the patient attends annual eye exams? Dr.  If pt is not established with a provider, would they like to be referred to a provider to establish care?  N/A .   Dental Screening: Recommended annual dental exams for proper oral hygiene  Community Resource Referral / Chronic Care Management: CRR required this visit?   No   CCM required this visit?  No      Plan:     I have personally reviewed and noted the following in the patient's chart:   Medical and social history Use of alcohol, tobacco or illicit drugs  Current medications and supplements including opioid prescriptions. Patient is not currently taking opioid prescriptions. Functional ability and status Nutritional status Physical activity Advanced directives List of other physicians Hospitalizations, surgeries, and ER visits in previous 12 months Vitals Screenings to include cognitive, depression, and falls Referrals and appointments  In addition, I have reviewed and discussed with patient certain preventive protocols, quality metrics, and best practice recommendations. A written personalized care plan for preventive services as well as general preventive health recommendations were provided to patient.     Michiel Cowboy, RN   01/02/2022   Nurse Notes:  Renee Jordan , Thank you for taking time to come for your Medicare Wellness Visit. I appreciate your ongoing commitment to your health goals. Please review the following plan we discussed and let me know if I can assist you in the future.  These are the goals we discussed:  Goals      Patient Stated     Maintain my independence; continue to be physically active.        This is a list of the screening recommended for you and due dates:  Health Maintenance  Topic Date Due   Zoster (Shingles) Vaccine (1 of 2) Never done   Pneumonia Vaccine (1 - PCV) Never done   DEXA scan (bone density measurement)  02/25/2019   COVID-19 Vaccine (4 - Pfizer series) 08/13/2019   Flu Shot  10/29/2021   Mammogram  02/28/2022   Cologuard (Stool DNA test)  10/06/2023   Tetanus Vaccine  08/23/2029   Hepatitis C Screening: USPSTF Recommendation to screen - Ages 18-79 yo.  Completed   HPV Vaccine  Aged Out

## 2022-01-01 NOTE — Patient Instructions (Signed)

## 2022-01-02 ENCOUNTER — Ambulatory Visit (INDEPENDENT_AMBULATORY_CARE_PROVIDER_SITE_OTHER): Payer: Medicare Other | Admitting: *Deleted

## 2022-01-02 DIAGNOSIS — Z0000 Encounter for general adult medical examination without abnormal findings: Secondary | ICD-10-CM | POA: Diagnosis not present

## 2022-01-28 ENCOUNTER — Ambulatory Visit
Admission: RE | Admit: 2022-01-28 | Discharge: 2022-01-28 | Disposition: A | Discharge status: Home / Self Care | Payer: Medicare Other | Source: Ambulatory Visit | Attending: Endocrinology | Admitting: Endocrinology

## 2022-01-28 ENCOUNTER — Other Ambulatory Visit: Payer: Self-pay | Admitting: Endocrinology

## 2022-01-28 DIAGNOSIS — E010 Iodine-deficiency related diffuse (endemic) goiter: Secondary | ICD-10-CM

## 2022-06-20 IMAGING — US US THYROID
1 series · 13 of 25 positions shown · non-contrast
Comparison: Multiple priors, most recently March 2019

CLINICAL DATA: Prior ultrasound follow-up. Previous biopsies
bilateral thyroid nodules in 9449, 7249

EXAM:
THYROID ULTRASOUND
TECHNIQUE: Ultrasound examination of the thyroid gland and adjacent soft
tissues was performed.

[Series 1: us thyroid · 0.06mm/px · 13 of 69 slices shown]
[im 1/69]
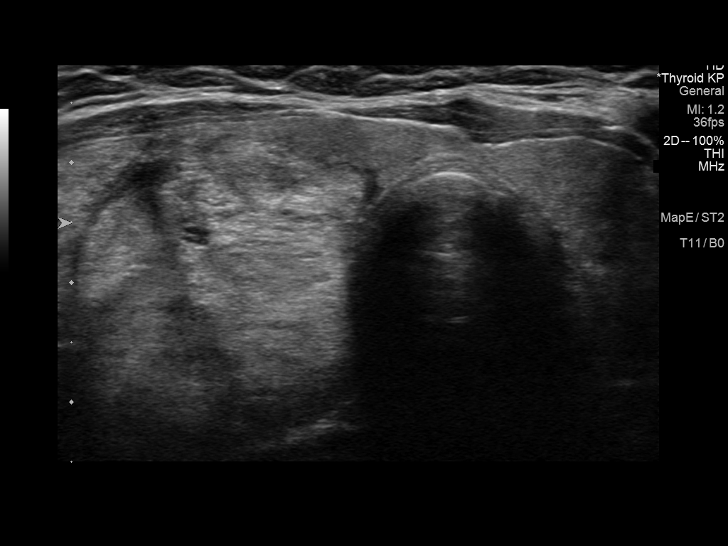
[im 6/69]
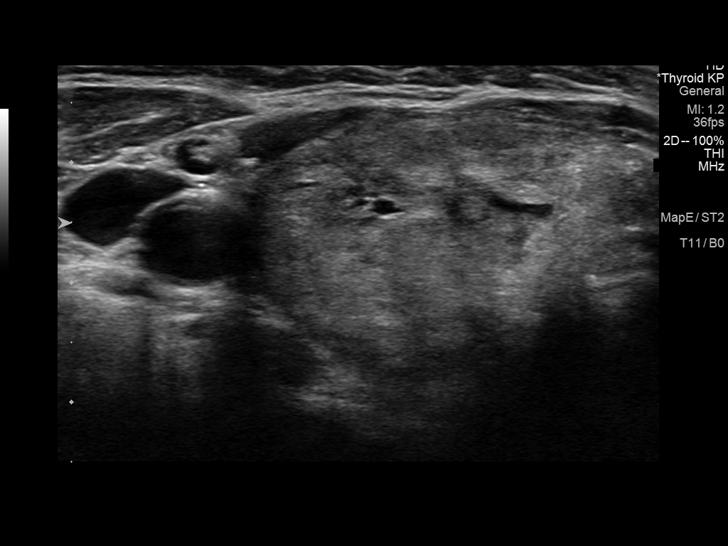
[im 12/69]
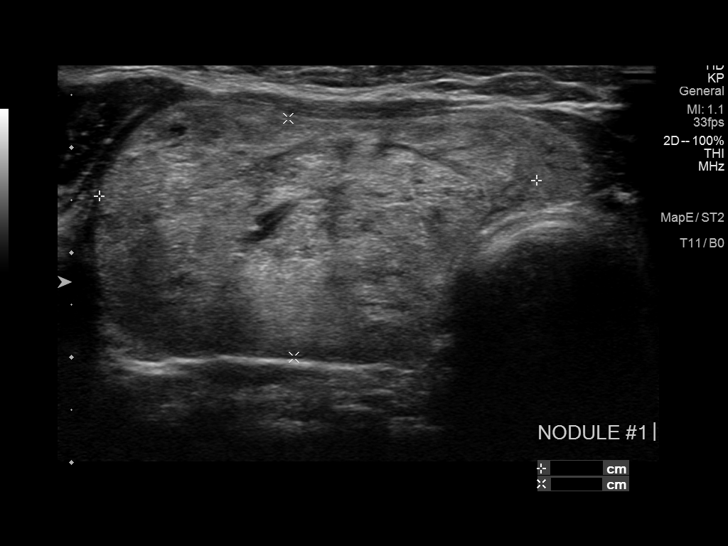
[im 18/69]
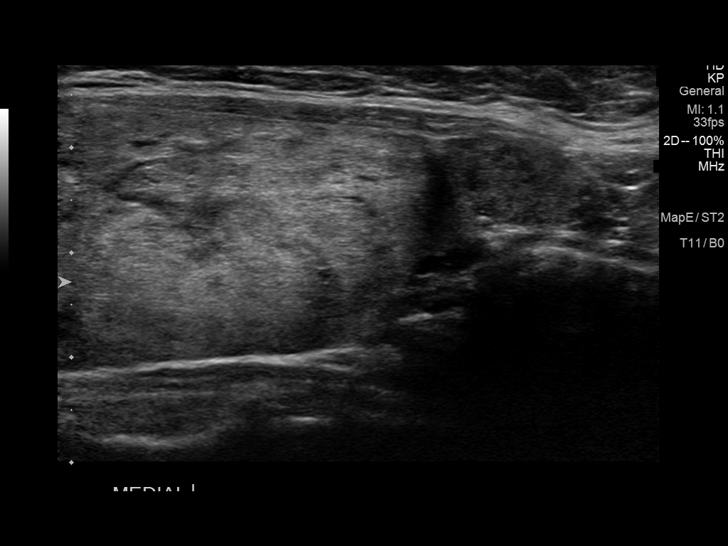
[im 23/69]
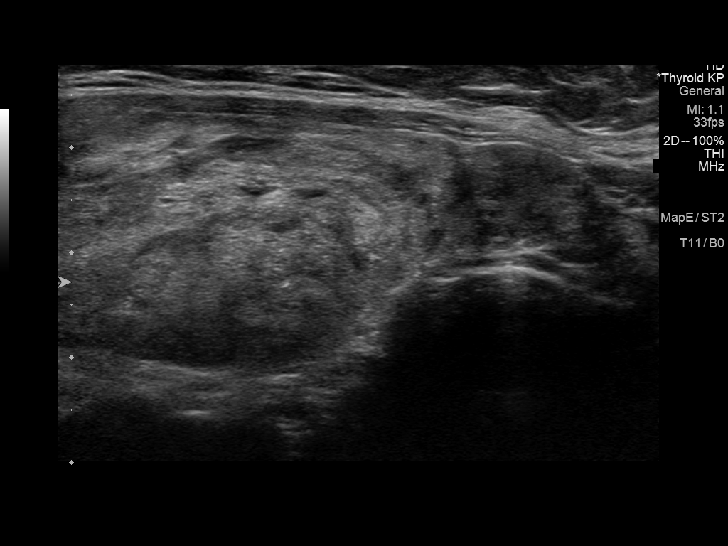
[im 29/69]
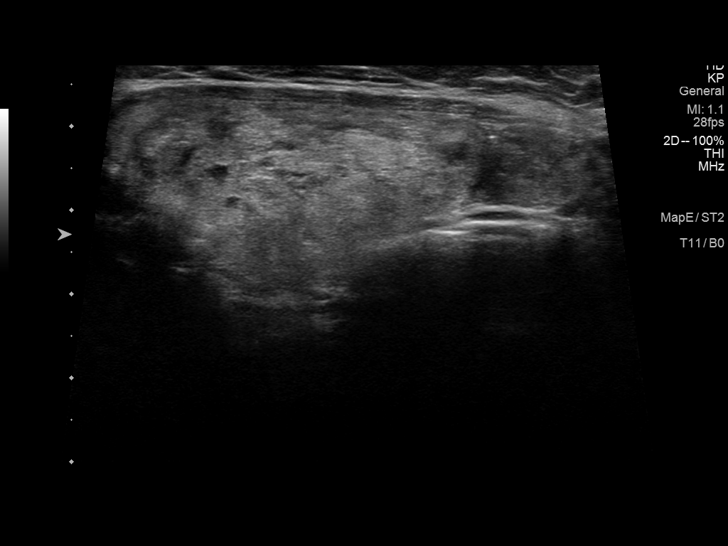
[im 35/69]
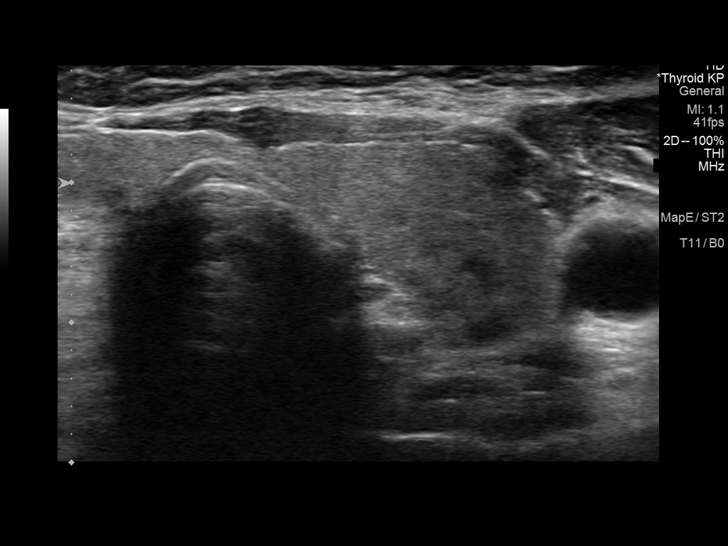
[im 40/69]
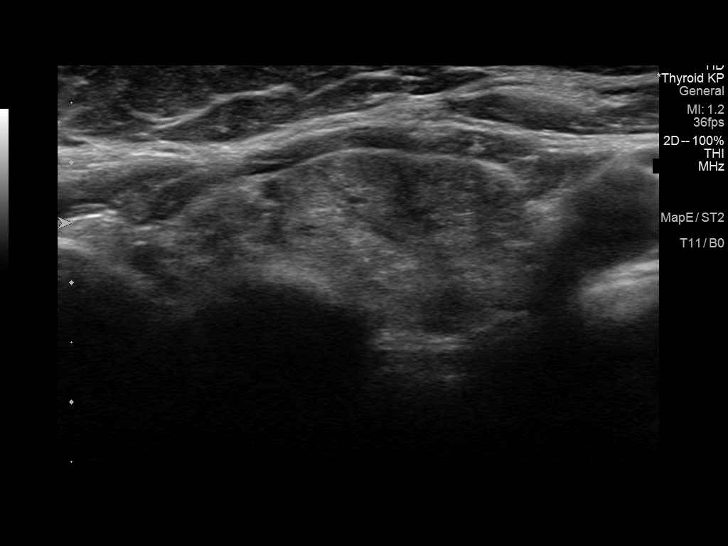
[im 46/69]
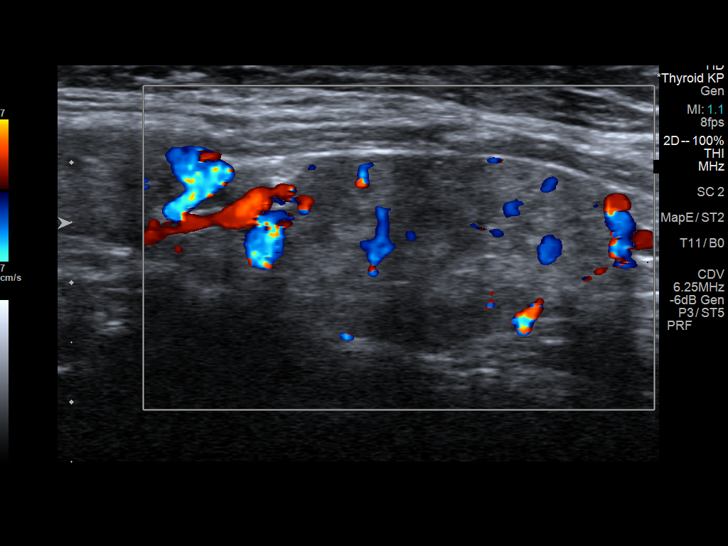
[im 52/69]
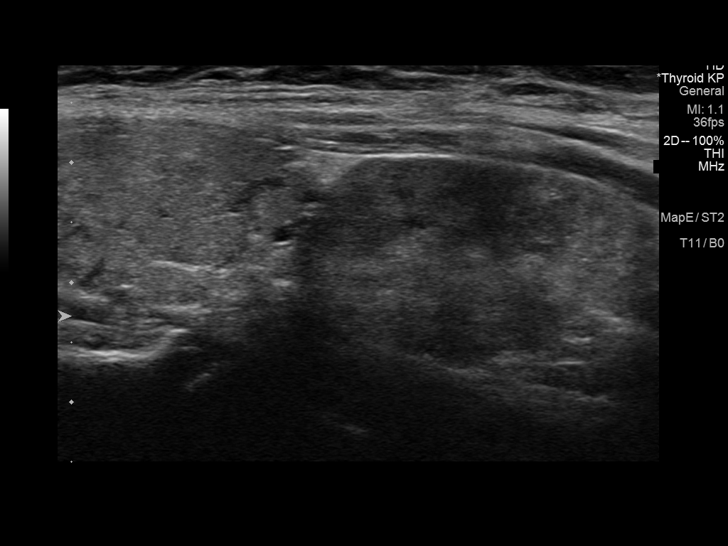
[im 57/69]
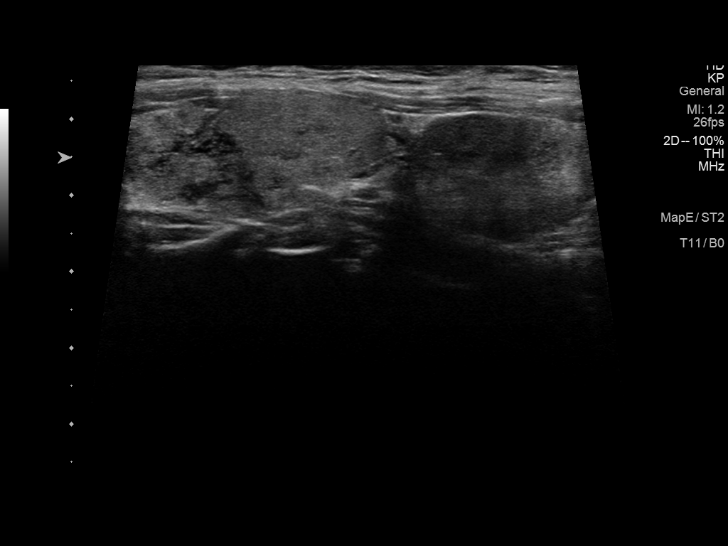
[im 63/69]
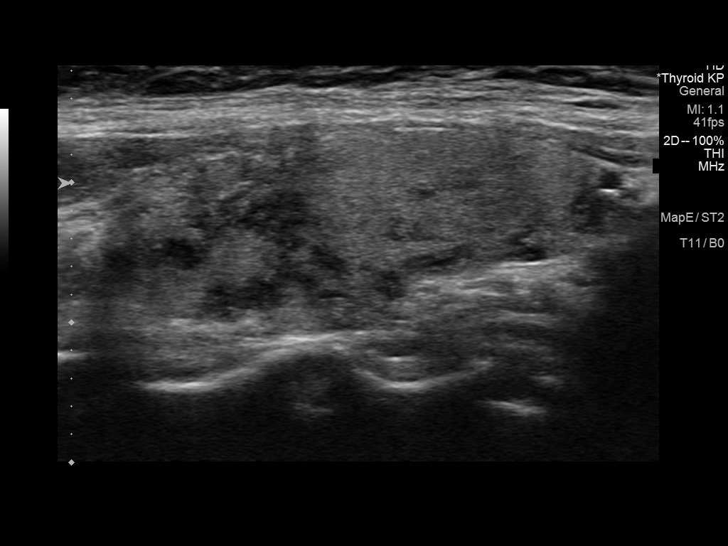
[im 69/69]
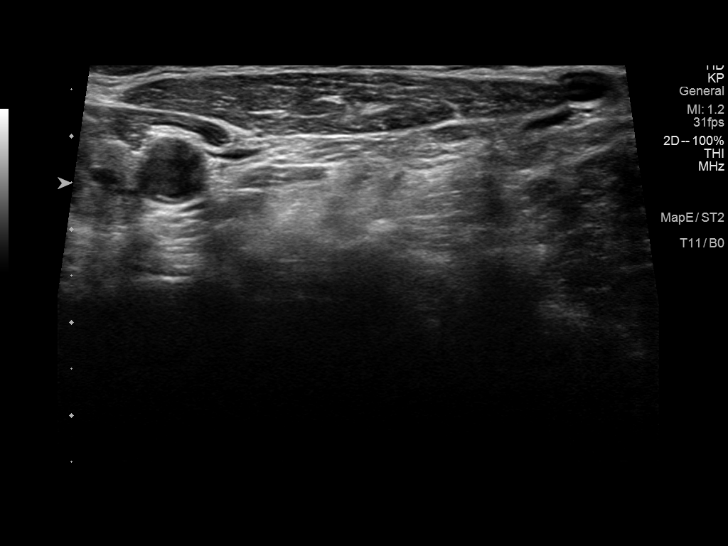

[13 of 25 positions shown; findings below may reference images not displayed]

FINDINGS: Parenchymal Echotexture: Moderately heterogenous

Isthmus: 0.4 cm

Right lobe: 6.3 x 2.3 x 3.6 cm

Left lobe: 6.2 x 1.8 x 1.5 cm

_________________________________________________________

Estimated total number of nodules >/= 1 cm: 3

Number of spongiform nodules >/=  2 cm not described below (TR1): 0

Number of mixed cystic and solid nodules >/= 1.5 cm not described
below (TR2): 0

_________________________________________________________

Nodule labeled 1 again refers to a large solid isoechoic nodule in
the right thyroid lobe which measures up to 5.0 x 4.2 x 2.3 cm,
previously measuring up to 5 cm by my measurements in 6969. It is
not significantly changed in size or morphology. This nodule was
previously biopsied in 9449 and 7249.

Nodule labeled 2 is a solid hypoechoic nodule in the inferior right
thyroid lobe measuring up to 1.4 cm, previously 1.3 cm in 6969. Does
not significantly changed in size or morphology. Is previously
described have demonstrated 5 year stability on previous report,
implying benign nature.

Nodule labeled 3 refers to a solid predominantly isoechoic nodule in
the inferior left thyroid lobe measuring up to 2.8 cm, previously
3.0 cm in 6969. This nodule was previously biopsied in 9449.
IMPRESSION: 1. Multinodular thyroid gland.  No new or enlarging thyroid nodules.
2. Nodules labeled 1 and 3 were previously biopsied in 9449 and/or
7249. They remain stable in appearance. Correlate with biopsy
results.
3. Nodule labeled 2 remains unchanged, previously described have
demonstrated 5 year stability implying benign nature. No further
dedicated follow-up for this nodule recommended.

The above is in keeping with the ACR TI-RADS recommendations - [HOSPITAL] 1090;[DATE].

## 2022-09-12 ENCOUNTER — Other Ambulatory Visit: Payer: Self-pay | Admitting: Obstetrics and Gynecology

## 2022-09-12 DIAGNOSIS — M8580 Other specified disorders of bone density and structure, unspecified site: Secondary | ICD-10-CM

## 2022-09-12 DIAGNOSIS — R928 Other abnormal and inconclusive findings on diagnostic imaging of breast: Secondary | ICD-10-CM

## 2022-10-03 ENCOUNTER — Ambulatory Visit
Admission: RE | Admit: 2022-10-03 | Discharge: 2022-10-03 | Disposition: A | Discharge status: Home / Self Care | Payer: Medicare Other | Source: Ambulatory Visit | Attending: Obstetrics and Gynecology | Admitting: Obstetrics and Gynecology

## 2022-10-03 DIAGNOSIS — R928 Other abnormal and inconclusive findings on diagnostic imaging of breast: Secondary | ICD-10-CM

## 2022-10-06 ENCOUNTER — Other Ambulatory Visit: Payer: Self-pay | Admitting: Obstetrics and Gynecology

## 2022-10-06 DIAGNOSIS — N6489 Other specified disorders of breast: Secondary | ICD-10-CM

## 2023-01-15 ENCOUNTER — Encounter: Payer: Self-pay | Admitting: Internal Medicine

## 2023-01-16 NOTE — Telephone Encounter (Signed)
Print this and hand to shannon?

## 2023-02-20 ENCOUNTER — Ambulatory Visit
Admission: RE | Admit: 2023-02-20 | Discharge: 2023-02-20 | Disposition: A | Discharge status: Home / Self Care | Payer: Medicare Other | Source: Ambulatory Visit | Attending: Obstetrics and Gynecology | Admitting: Obstetrics and Gynecology

## 2023-02-20 DIAGNOSIS — M8580 Other specified disorders of bone density and structure, unspecified site: Secondary | ICD-10-CM

## 2023-04-07 ENCOUNTER — Ambulatory Visit
Admission: RE | Admit: 2023-04-07 | Discharge: 2023-04-07 | Disposition: A | Discharge status: Home / Self Care | Payer: Medicare Other | Source: Ambulatory Visit | Attending: Obstetrics and Gynecology | Admitting: Obstetrics and Gynecology

## 2023-04-07 ENCOUNTER — Other Ambulatory Visit: Payer: Self-pay | Admitting: Obstetrics and Gynecology

## 2023-04-07 DIAGNOSIS — N6489 Other specified disorders of breast: Secondary | ICD-10-CM

## 2023-07-21 LAB — TSH: TSH: 0.98 (ref 0.41–5.90)

## 2023-07-28 ENCOUNTER — Ambulatory Visit (INDEPENDENT_AMBULATORY_CARE_PROVIDER_SITE_OTHER): Admitting: Internal Medicine

## 2023-07-28 ENCOUNTER — Encounter: Payer: Self-pay | Admitting: Internal Medicine

## 2023-07-28 VITALS — BP 140/100 | HR 69 | Temp 98.6°F | Ht 66.0 in | Wt 185.0 lb

## 2023-07-28 DIAGNOSIS — R200 Anesthesia of skin: Secondary | ICD-10-CM | POA: Insufficient documentation

## 2023-07-28 DIAGNOSIS — H9193 Unspecified hearing loss, bilateral: Secondary | ICD-10-CM | POA: Diagnosis not present

## 2023-07-28 DIAGNOSIS — R739 Hyperglycemia, unspecified: Secondary | ICD-10-CM | POA: Diagnosis not present

## 2023-07-28 DIAGNOSIS — E559 Vitamin D deficiency, unspecified: Secondary | ICD-10-CM | POA: Diagnosis not present

## 2023-07-28 DIAGNOSIS — M150 Primary generalized (osteo)arthritis: Secondary | ICD-10-CM

## 2023-07-28 DIAGNOSIS — R030 Elevated blood-pressure reading, without diagnosis of hypertension: Secondary | ICD-10-CM | POA: Insufficient documentation

## 2023-07-28 DIAGNOSIS — Z13220 Encounter for screening for lipoid disorders: Secondary | ICD-10-CM

## 2023-07-28 LAB — LIPID PANEL
Cholesterol: 341 mg/dL — ABNORMAL HIGH (ref 0–200)
HDL: 55.2 mg/dL (ref >39.00)
LDL Cholesterol: 225 mg/dL — ABNORMAL HIGH (ref 0–99)
NonHDL: 286.06
Total CHOL/HDL Ratio: 6
Triglycerides: 304 mg/dL — ABNORMAL HIGH (ref 0.0–149.0)
VLDL: 60.8 mg/dL — ABNORMAL HIGH (ref 0.0–40.0)

## 2023-07-28 LAB — COMPREHENSIVE METABOLIC PANEL WITH GFR
ALT: 18 U/L (ref 0–35)
AST: 21 U/L (ref 0–37)
Albumin: 3.9 g/dL (ref 3.5–5.2)
Alkaline Phosphatase: 57 U/L (ref 39–117)
BUN: 17 mg/dL (ref 6–23)
CO2: 30 meq/L (ref 19–32)
Calcium: 9.2 mg/dL (ref 8.4–10.5)
Chloride: 103 meq/L (ref 96–112)
Creatinine, Ser: 0.84 mg/dL (ref 0.40–1.20)
GFR: 70.9 mL/min (ref >60.00)
Glucose, Bld: 101 mg/dL — ABNORMAL HIGH (ref 70–99)
Potassium: 3.8 meq/L (ref 3.5–5.1)
Sodium: 138 meq/L (ref 135–145)
Total Bilirubin: 0.5 mg/dL (ref 0.2–1.2)
Total Protein: 6.6 g/dL (ref 6.0–8.3)

## 2023-07-28 LAB — CBC
HCT: 42.4 % (ref 36.0–46.0)
Hemoglobin: 14.1 g/dL (ref 12.0–15.0)
MCHC: 33.2 g/dL (ref 30.0–36.0)
MCV: 93.8 fl (ref 78.0–100.0)
Platelets: 293 10*3/uL (ref 150.0–400.0)
RBC: 4.52 Mil/uL (ref 3.87–5.11)
RDW: 13.2 % (ref 11.5–15.5)
WBC: 5 10*3/uL (ref 4.0–10.5)

## 2023-07-28 LAB — VITAMIN D 25 HYDROXY (VIT D DEFICIENCY, FRACTURES): VITD: 12.59 ng/mL — ABNORMAL LOW (ref 30.00–100.00)

## 2023-07-28 LAB — VITAMIN B12: Vitamin B-12: 263 pg/mL (ref 211–911)

## 2023-07-28 LAB — HEMOGLOBIN A1C: Hgb A1c MFr Bld: 5.8 % (ref 4.6–6.5)

## 2023-07-28 NOTE — Assessment & Plan Note (Signed)
 Checking CMP and CBC and if normal okay with prescribing diclofenac which she is taking 75 mg daily.

## 2023-07-28 NOTE — Patient Instructions (Addendum)
 We will check the labs today and get the hearing checked.

## 2023-07-28 NOTE — Assessment & Plan Note (Signed)
 Needs hearing assessment referral to audiology done.

## 2023-07-28 NOTE — Progress Notes (Signed)
   Subjective:   Patient ID: Renee Jordan, female    DOB: 08-24-1955, 68 y.o.   MRN: [id number]  HPI The patient is a 68 YO female coming in for medical management (see A/P).   Review of Systems  Constitutional: Negative.   HENT: Negative.    Eyes: Negative.   Respiratory:  Negative for cough, chest tightness and shortness of breath.   Cardiovascular:  Negative for chest pain, palpitations and leg swelling.  Gastrointestinal:  Negative for abdominal distention, abdominal pain, constipation, diarrhea, nausea and vomiting.  Musculoskeletal:  Positive for arthralgias.  Skin: Negative.   Neurological:  Positive for numbness.  Psychiatric/Behavioral: Negative.      Objective:  Physical Exam Constitutional:      Appearance: She is well-developed.  HENT:     Head: Normocephalic and atraumatic.  Cardiovascular:     Rate and Rhythm: Normal rate and regular rhythm.  Pulmonary:     Effort: Pulmonary effort is normal. No respiratory distress.     Breath sounds: Normal breath sounds. No wheezing or rales.  Abdominal:     General: Bowel sounds are normal. There is no distension.     Palpations: Abdomen is soft.     Tenderness: There is no abdominal tenderness. There is no rebound.  Musculoskeletal:        General: Tenderness present.     Cervical back: Normal range of motion.  Skin:    General: Skin is warm and dry.  Neurological:     Mental Status: She is alert and oriented to person, place, and time.     Coordination: Coordination normal.     Vitals:   07/28/23 0835 07/28/23 0851  BP: (!) 160/100 (!) 160/100  Pulse: 69   Temp: 98.6 F (37 C)   TempSrc: Oral   SpO2: 96%   Weight: 185 lb (83.9 kg)   Height: 5\' 6"  (1.676 m)     Assessment & Plan:

## 2023-07-28 NOTE — Assessment & Plan Note (Signed)
 She is taking diclofenac daily for a while which could be impacting as well as recent road trip with likely more salt containing foods. Checking CMP and having her check BP at home consistently and let us  know readings. Last visit with us  2023 with normal reading.

## 2023-07-28 NOTE — Assessment & Plan Note (Signed)
 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 Checking HgA1c, B12, TSH, vitamin D  to assess for metabolic cause of nerve changes.

## 2023-07-31 ENCOUNTER — Other Ambulatory Visit: Payer: Self-pay | Admitting: Internal Medicine

## 2023-07-31 ENCOUNTER — Encounter: Payer: Self-pay | Admitting: Internal Medicine

## 2023-07-31 MED ORDER — VITAMIN D (ERGOCALCIFEROL) 1.25 MG (50000 UNIT) PO CAPS: 50000.0000 [IU] | ORAL_CAPSULE | ORAL | 0 refills | Status: DC

## 2023-08-03 MED ORDER — DICLOFENAC SODIUM 75 MG PO TBEC: 75.0000 mg | DELAYED_RELEASE_TABLET | Freq: Two times a day (BID) | ORAL | 1 refills | Status: DC | PRN

## 2023-08-04 MED ORDER — PRAVASTATIN SODIUM 20 MG PO TABS: 20.0000 mg | ORAL_TABLET | Freq: Every day | ORAL | 3 refills | Status: AC

## 2023-08-04 NOTE — Addendum Note (Signed)
 Addended by: Bambi Lever A on: 08/04/2023 11:29 AM   Modules accepted: Orders

## 2023-08-20 ENCOUNTER — Encounter: Payer: Self-pay | Admitting: Internal Medicine

## 2023-08-21 ENCOUNTER — Ambulatory Visit (INDEPENDENT_AMBULATORY_CARE_PROVIDER_SITE_OTHER): Admitting: Podiatry

## 2023-08-21 ENCOUNTER — Ambulatory Visit (INDEPENDENT_AMBULATORY_CARE_PROVIDER_SITE_OTHER)

## 2023-08-21 ENCOUNTER — Encounter: Payer: Self-pay | Admitting: Podiatry

## 2023-08-21 VITALS — Ht 66.0 in | Wt 185.0 lb

## 2023-08-21 DIAGNOSIS — M79671 Pain in right foot: Secondary | ICD-10-CM

## 2023-08-21 DIAGNOSIS — M7752 Other enthesopathy of left foot: Secondary | ICD-10-CM | POA: Diagnosis not present

## 2023-08-21 DIAGNOSIS — M205X2 Other deformities of toe(s) (acquired), left foot: Secondary | ICD-10-CM

## 2023-08-21 DIAGNOSIS — G8929 Other chronic pain: Secondary | ICD-10-CM | POA: Diagnosis not present

## 2023-08-21 DIAGNOSIS — M7751 Other enthesopathy of right foot: Secondary | ICD-10-CM | POA: Diagnosis not present

## 2023-08-21 MED ORDER — TRIAMCINOLONE ACETONIDE 10 MG/ML IJ SUSP
10.0000 mg | Freq: Once | INTRAMUSCULAR | Status: AC
Start: 2023-08-21 — End: 2023-08-21
  Administered 2023-08-21: 10 mg via INTRA_ARTICULAR

## 2023-08-21 NOTE — Progress Notes (Signed)
 Subjective:   Patient ID: Renee Jordan, female   DOB: 68 y.o.   MRN: [id number]   HPI Patient states that she went hiking and has developed quite a bit of pain in her forefoot left and states it hurts when she bends her toe.  States it burns and she did have previous bunion surgery done approximately 20 years ago.  Patient does not smoke likes to be active   Review of Systems  All other systems reviewed and are negative.       Objective:  Physical Exam Vitals and nursing note reviewed.  Constitutional:      Appearance: She is well-developed.  Pulmonary:     Effort: Pulmonary effort is normal.  Musculoskeletal:        General: Normal range of motion.  Skin:    General: Skin is warm.  Neurological:     Mental Status: She is alert.     Neurovascular status intact muscle strength found to be adequate range of motion adequate with exquisite discomfort noted in the left first MPJ lateral and central portion with reduced range of motion no crepitus of the joint.  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

## 2023-08-26 ENCOUNTER — Ambulatory Visit: Attending: Internal Medicine | Admitting: Audiologist

## 2023-08-26 DIAGNOSIS — H903 Sensorineural hearing loss, bilateral: Secondary | ICD-10-CM | POA: Insufficient documentation

## 2023-08-26 DIAGNOSIS — H93A3 Pulsatile tinnitus, bilateral: Secondary | ICD-10-CM | POA: Diagnosis present

## 2023-08-26 NOTE — Procedures (Signed)
  Outpatient [facility] and [facility] [address] [place], [place] [address] [phone]  AUDIOLOGICAL EVALUATION  Name: Renee Jordan    Status: Outpatient DOB: [birth year]    Referent: [name], MD MRN: [id number] Date: 08/26/2023     Diagnosis: Sensorineural hearing loss, bilateral  Pulsatile tinnitus of both ears   HISTORY: Renee Jordan, 68 y.o., was seen for an audiological evaluation.   Renee Jordan notices that she is needing frequent repetition and struggling to hear her husband. She also notices that well known songs sound off key and she struggles to hear in noisy situations.  Renee Jordan notes no ear pain, pressure, or dizziness.  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 Renee Jordan reports occasionally hearing a "pulsing sound" tinnitus, like her heart beat, in both ears. This has been occurring for a couple of years. She notices the tinnitus when laying down relaxing. She notes no history of ear surgery or ear infections.          EVALUATION: Otoscopic inspection reveals clear ear canals with visible tympanic membranes.   Tympanometry was completed to assess middle ear status. Normal, Type A tympanograms were obtained bilaterally.  Standard audiometric techniques were used to obtain thresholds under high frequency headphones and rechecked under insert earphones. Speech reception thresholds are 25 dBHL on the right and 25 dBHL on the left using recorded spondee word lists. Word recognition was 92% at 65 dBHL on the right and 96% at 65 dBHL on the left using recorded NU-6 word lists, in quiet. A mild sloping to moderate rising to mild sensorineural hearing loss was found bilaterally.  CONCLUSION:  Renee Jordan has a mild sloping to moderate, rising to mild sensorineural hearing loss bilaterally. Word recognition is good in quiet at conversational speech levels bilaterally. Findings were reviewed with the patient.    RECOMMENDATIONS: Medical evaluation by an Ear, Nose and Throat physician.   Pending medical clearance by ENT, use of binaural amplification to assist in daily listening situations. A list of area hearing aid providers was given to the patient. Use of communication strategies to improve communication in difficult listening situations.  Audiogram scanned in under media tab. 49 minutes spent testing and counseling.  Burgess Caroline, Au.D., CCC-A Audiologist 08/26/2023  cc: [name], MD

## 2023-08-31 ENCOUNTER — Other Ambulatory Visit: Payer: Self-pay | Admitting: Internal Medicine

## 2023-08-31 DIAGNOSIS — H93A3 Pulsatile tinnitus, bilateral: Secondary | ICD-10-CM

## 2023-10-07 ENCOUNTER — Ambulatory Visit
Admission: RE | Admit: 2023-10-07 | Discharge: 2023-10-07 | Disposition: A | Discharge status: Home / Self Care | Source: Ambulatory Visit | Attending: Obstetrics and Gynecology | Admitting: Obstetrics and Gynecology

## 2023-10-07 DIAGNOSIS — N6489 Other specified disorders of breast: Secondary | ICD-10-CM

## 2023-10-08 ENCOUNTER — Encounter: Payer: Self-pay | Admitting: Internal Medicine

## 2023-10-08 DIAGNOSIS — Z1211 Encounter for screening for malignant neoplasm of colon: Secondary | ICD-10-CM

## 2023-10-16 ENCOUNTER — Telehealth: Payer: Self-pay | Admitting: Internal Medicine

## 2023-10-16 NOTE — Telephone Encounter (Signed)
 Patient has a lab visit scheduled with no orders - please enter orders or we need to remove this visit.

## 2023-10-20 NOTE — Telephone Encounter (Signed)
 I will cancel due to no note in patient last AVS stating she needs labs done

## 2023-10-20 NOTE — Telephone Encounter (Signed)
 Labs will be ordered same day after her visit most likely

## 2023-10-28 ENCOUNTER — Encounter: Payer: Self-pay | Admitting: Internal Medicine

## 2023-10-28 DIAGNOSIS — E782 Mixed hyperlipidemia: Secondary | ICD-10-CM

## 2023-10-28 DIAGNOSIS — E559 Vitamin D deficiency, unspecified: Secondary | ICD-10-CM

## 2023-10-29 NOTE — Telephone Encounter (Signed)
 I could fine anything in recent office notes hat stated you wanted her to come back in 3 months to check labs. I believe I may ave canceled this appt when it was brought to my attention. If patient is suppose to have labs done I can give her a call and reschedule

## 2023-10-30 DIAGNOSIS — E782 Mixed hyperlipidemia: Secondary | ICD-10-CM | POA: Insufficient documentation

## 2023-10-30 DIAGNOSIS — E559 Vitamin D deficiency, unspecified: Secondary | ICD-10-CM | POA: Insufficient documentation

## 2023-10-30 NOTE — Telephone Encounter (Signed)
 I have entered the labs. Ok for lab visit prior

## 2023-11-04 ENCOUNTER — Ambulatory Visit: Payer: Self-pay | Admitting: Internal Medicine

## 2023-11-04 LAB — COLOGUARD
COLOGUARD: NEGATIVE
Cologuard: NEGATIVE

## 2023-11-11 ENCOUNTER — Other Ambulatory Visit

## 2023-11-11 ENCOUNTER — Other Ambulatory Visit (INDEPENDENT_AMBULATORY_CARE_PROVIDER_SITE_OTHER)

## 2023-11-11 DIAGNOSIS — E782 Mixed hyperlipidemia: Secondary | ICD-10-CM

## 2023-11-11 DIAGNOSIS — E559 Vitamin D deficiency, unspecified: Secondary | ICD-10-CM

## 2023-11-11 LAB — COMPREHENSIVE METABOLIC PANEL WITH GFR
ALT: 15 U/L (ref 0–35)
AST: 18 U/L (ref 0–37)
Albumin: 3.9 g/dL (ref 3.5–5.2)
Alkaline Phosphatase: 53 U/L (ref 39–117)
BUN: 19 mg/dL (ref 6–23)
CO2: 27 meq/L (ref 19–32)
Calcium: 8.9 mg/dL (ref 8.4–10.5)
Chloride: 105 meq/L (ref 96–112)
Creatinine, Ser: 0.95 mg/dL (ref 0.40–1.20)
GFR: 61.05 mL/min (ref >60.00)
Glucose, Bld: 98 mg/dL (ref 70–99)
Potassium: 3.8 meq/L (ref 3.5–5.1)
Sodium: 140 meq/L (ref 135–145)
Total Bilirubin: 0.5 mg/dL (ref 0.2–1.2)
Total Protein: 6.4 g/dL (ref 6.0–8.3)

## 2023-11-11 LAB — LIPID PANEL
Cholesterol: 251 mg/dL — ABNORMAL HIGH (ref 0–200)
HDL: 59.2 mg/dL (ref >39.00)
LDL Cholesterol: 162 mg/dL — ABNORMAL HIGH (ref 0–99)
NonHDL: 192.25
Total CHOL/HDL Ratio: 4
Triglycerides: 150 mg/dL — ABNORMAL HIGH (ref 0.0–149.0)
VLDL: 30 mg/dL (ref 0.0–40.0)

## 2023-11-11 LAB — VITAMIN D 25 HYDROXY (VIT D DEFICIENCY, FRACTURES): VITD: 31.75 ng/mL (ref 30.00–100.00)

## 2023-11-12 ENCOUNTER — Ambulatory Visit (INDEPENDENT_AMBULATORY_CARE_PROVIDER_SITE_OTHER): Admitting: Otolaryngology

## 2023-11-12 ENCOUNTER — Encounter (INDEPENDENT_AMBULATORY_CARE_PROVIDER_SITE_OTHER): Admitting: Audiology

## 2023-11-12 ENCOUNTER — Encounter (INDEPENDENT_AMBULATORY_CARE_PROVIDER_SITE_OTHER): Payer: Self-pay

## 2023-11-12 VITALS — BP 150/88 | HR 70

## 2023-11-12 DIAGNOSIS — H903 Sensorineural hearing loss, bilateral: Secondary | ICD-10-CM

## 2023-11-12 DIAGNOSIS — H93A9 Pulsatile tinnitus, unspecified ear: Secondary | ICD-10-CM | POA: Diagnosis not present

## 2023-11-12 DIAGNOSIS — H93A3 Pulsatile tinnitus, bilateral: Secondary | ICD-10-CM

## 2023-11-12 NOTE — Progress Notes (Signed)
 Erroneous encounter

## 2023-11-13 ENCOUNTER — Ambulatory Visit: Payer: Self-pay | Admitting: Internal Medicine

## 2023-11-14 DIAGNOSIS — H93A3 Pulsatile tinnitus, bilateral: Secondary | ICD-10-CM | POA: Insufficient documentation

## 2023-11-14 DIAGNOSIS — H903 Sensorineural hearing loss, bilateral: Secondary | ICD-10-CM | POA: Insufficient documentation

## 2023-11-14 NOTE — Progress Notes (Signed)
 CC: Bilateral pulsatile tinnitus  HPI:  Renee Jordan is a 68 y.o. female who presents today complaining of bilateral pulsatile tinnitus for the past 2 to 3 years.  The tinnitus is intermittent, and is usually worse at night.  She denies any otalgia, otorrhea, or vertigo.  She has no recent otitis media or otitis externa.  She has no previous otologic surgery.  Her recent hearing test shows bilateral mild to moderate sensorineural hearing loss.  She has a history of thyroid  goiter and hypothyroidism.  Past Medical History:  Diagnosis Date   Anemia    Arthritis    History of chicken pox    History of measles, mumps, or rubella    HPV (human papilloma virus) infection    Infertility, female    Joint problem    Shingles    Thyroid  disease    Uterine polyp    Yeast infection     Past Surgical History:  Procedure Laterality Date   BUNIONECTOMY     DILATION AND CURETTAGE OF UTERUS     KNEE SURGERY  2010   ACL repair, microfracture   LAPAROSCOPY     knee   polyp removal     WISDOM TOOTH EXTRACTION      Family History  Problem Relation Age of Onset   Heart disease Brother    Diabetes Brother    Hyperlipidemia Brother    Hypertension Brother    Hearing loss Brother 37       CAD/PTCA   Asthma Sister    Emphysema Mother    Lung cancer Father 9       lung cancer/prostate cancer   Prostate cancer Father    Hypertension Brother    Stroke Paternal Grandfather    Stroke Maternal Grandmother    Heart disease Maternal Grandfather    Stroke Maternal Grandfather    Stroke Paternal Grandmother     Social History:  reports that she has never smoked. She has never used smokeless tobacco. She reports that she does not drink alcohol and does not use drugs.  Allergies:  Allergies  Allergen Reactions   Codeine Nausea And Vomiting    Prior to Admission medications   Medication Sig Start Date End Date Taking? Authorizing Provider  acetaminophen (TYLENOL) 500 MG tablet Take 500  mg by mouth every 6 (six) hours as needed.   Yes [provider]  cyclobenzaprine  (FLEXERIL ) 5 MG tablet Take 1 tablet (5 mg total) by mouth 3 (three) times daily as needed for muscle spasms. 09/18/20  Yes Rollene Almarie LABOR, MD  diclofenac  (VOLTAREN ) 75 MG EC tablet Take 1 tablet (75 mg total) by mouth 2 (two) times daily as needed. 08/03/23  Yes Rollene Almarie LABOR, MD  levothyroxine (SYNTHROID, LEVOTHROID) 50 MCG tablet Take 50 mcg by mouth daily before breakfast.   Yes [provider]  pravastatin  (PRAVACHOL ) 20 MG tablet Take 1 tablet (20 mg total) by mouth daily. 08/04/23  Yes Rollene Almarie LABOR, MD  Vitamin D , Ergocalciferol , (DRISDOL ) 1.25 MG (50000 UNIT) CAPS capsule Take 1 capsule (50,000 Units total) by mouth every 7 (seven) days. 07/31/23  Yes Rollene Almarie LABOR, MD    Blood pressure (!) 150/88, pulse 70, SpO2 97%. Exam: General: Communicates without difficulty, well nourished, no acute distress. Head: Normocephalic, no evidence injury, no tenderness, facial buttresses intact without stepoff. Face/sinus: No tenderness to palpation and percussion. Facial movement is normal and symmetric. Eyes: PERRL, EOMI. No scleral icterus, conjunctivae clear. Neuro: CN II exam  reveals vision grossly intact.  No nystagmus at any point of gaze. Ears: Auricles well formed without lesions.  Ear canals are intact without mass or lesion.  No erythema or edema is appreciated.  The TMs are intact without fluid. Nose: External evaluation reveals normal support and skin without lesions.  Dorsum is intact.  Anterior rhinoscopy reveals normal mucosa over anterior aspect of inferior turbinates and intact septum.  No purulence noted. Oral:  Oral cavity and oropharynx are intact, symmetric, without erythema or edema.  Mucosa is moist without lesions. Neck: Full range of motion without pain.  There is no significant lymphadenopathy.  No masses palpable.  Thyroid  bed is enlarged.  Parotid glands and  submandibular glands equal bilaterally without mass.  Trachea is midline. Neuro:  CN 2-12 grossly intact.   Assessment: 1.  The patient's ear canals, tympanic membranes, and middle ear spaces are normal.  No middle ear effusion or infection is noted. 2.  Pulsatile tinnitus of unknown etiology.  The possible differential diagnoses include vascular tumor, AV malformation, hyperdynamic state, aneurysm, sinus thrombosis, or other idiopathic causes.    Plan: 1.  The physical exam findings and the hearing test results are reviewed with the patient. 2.  The possible causes of pulsatile tinnitus are extensively reviewed with the patient. 3.  MRI scan with gadolinium contrast and CT angiography are recommended to evaluate the possible causes of her pulsatile tinnitus. 4.  The strategies to cope with tinnitus, including the use of masker, hearing aids, tinnitus retraining therapy, and avoidance of caffeine and alcohol are discussed. 5.  The patient will return for reevaluation in 1 year, sooner if abnormalities are noted on her imaging studies.  Volanda Mangine W Kingsten Enfield 11/14/2023, 3:30 PM

## 2023-11-17 ENCOUNTER — Ambulatory Visit: Admitting: Internal Medicine

## 2023-11-17 ENCOUNTER — Encounter: Payer: Self-pay | Admitting: Internal Medicine

## 2023-11-17 VITALS — BP 138/80 | HR 58 | Temp 97.8°F | Ht 66.0 in | Wt 181.0 lb

## 2023-11-17 DIAGNOSIS — E559 Vitamin D deficiency, unspecified: Secondary | ICD-10-CM | POA: Diagnosis not present

## 2023-11-17 DIAGNOSIS — H93A3 Pulsatile tinnitus, bilateral: Secondary | ICD-10-CM | POA: Diagnosis not present

## 2023-11-17 DIAGNOSIS — M150 Primary generalized (osteo)arthritis: Secondary | ICD-10-CM

## 2023-11-17 DIAGNOSIS — E782 Mixed hyperlipidemia: Secondary | ICD-10-CM

## 2023-11-17 DIAGNOSIS — R030 Elevated blood-pressure reading, without diagnosis of hypertension: Secondary | ICD-10-CM

## 2023-11-17 NOTE — Assessment & Plan Note (Signed)
 Statin therapy has significantly improved her lipid levels, though she experiences statin-associated dry mouth, which is improving over time. Continue current statin therapy and re-evaluate the lipid panel in 3 months if persistently above goal will change dosing for now continue pravastatin  20 mg daily. Manage dry mouth with lozenges or lemon water, especially at night.

## 2023-11-17 NOTE — Assessment & Plan Note (Signed)
 Reviewed recent levels which are now in normal range on otc supplementation. Continue otc 2000 international units daily.

## 2023-11-17 NOTE — Assessment & Plan Note (Signed)
 Hearing loss is under evaluation. An ENT has been consulted, and an MRI is scheduled to rule out aneurysms and brain tumors. A CT scan is planned if the MRI is negative. Explore hearing aid options.

## 2023-11-17 NOTE — Progress Notes (Signed)
 Subjective:   Patient ID: Renee Jordan, female    DOB: 01-Apr-1955, 68 y.o.   MRN: [id number]  Discussed the use of AI scribe software for clinical note transcription with the patient, who gave verbal consent to proceed.  History of Present Illness Renee Jordan is a 68 year old female who presents for follow-up on cholesterol management and blood pressure monitoring.  She started on cholesterol medication three months ago, resulting in significant improvement in her lipid profile. Her total cholesterol decreased by 100 points, triglycerides by 150 points, HDL increased by 4-5 points, and LDL decreased by 60-70 points. She experiences dry mouth, particularly at night, which wakes her up to drink water several times. She has not made significant dietary changes.  Her blood pressure has been irregular, with an average reading of 140 mmHg, and brought readings with her  She takes diclofenac  sodium daily for hip pain. She is concerned about its impact on her kidney function, as her BUN and creatinine levels have increased but remain within the safe zone. She has previously tried meloxicam , which also affected her kidney function.  She underwent a hearing test and was referred for an MRI to rule out aneurysms and brain tumors due to a family history of aneurysms; her brother died of a stroke with four known aneurysms. She finds the process of obtaining hearing aids outside of insurance coverage to be cumbersome.  She experiences a radiating sensation from her chest down her arms two to three times a year, which occurred again this morning while sitting on the toilet. She describes it as a weakness and slight pain. No new chest pains, tightness, pressure, or breathing troubles.   Review of Systems  Constitutional: Negative.   HENT: Negative.    Eyes: Negative.   Respiratory:  Negative for cough, chest tightness and shortness of breath.   Cardiovascular:  Negative for chest  pain, palpitations and leg swelling.  Gastrointestinal:  Negative for abdominal distention, abdominal pain, constipation, diarrhea, nausea and vomiting.  Musculoskeletal: Negative.   Skin: Negative.   Neurological: Negative.   Psychiatric/Behavioral: Negative.      Objective:  Physical Exam Constitutional:      Appearance: She is well-developed.  HENT:     Head: Normocephalic and atraumatic.  Cardiovascular:     Rate and Rhythm: Normal rate and regular rhythm.  Pulmonary:     Effort: Pulmonary effort is normal. No respiratory distress.     Breath sounds: Normal breath sounds. No wheezing or rales.  Abdominal:     General: Bowel sounds are normal. There is no distension.     Palpations: Abdomen is soft.     Tenderness: There is no abdominal tenderness. There is no rebound.  Musculoskeletal:     Cervical back: Normal range of motion.  Skin:    General: Skin is warm and dry.  Neurological:     Mental Status: She is alert and oriented to person, place, and time.     Coordination: Coordination normal.     Vitals:   11/17/23 0905  BP: 138/80  Pulse: (!) 58  Temp: 97.8 F (36.6 C)  TempSrc: Oral  SpO2: 97%  Weight: 181 lb (82.1 kg)  Height: 5' 6 (1.676 m)    Assessment and Plan Assessment & Plan Mixed hyperlipidemia with statin-associated dry mouth   Statin therapy has significantly improved her lipid levels, though she experiences statin-associated dry mouth, which is improving over time. Continue current statin therapy and re-evaluate the  lipid panel in 3 months. Manage dry mouth with lozenges or lemon water, especially at night.  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 BUN and creatinine levels are increased but remain normal, raising renal concerns. Continue diclofenac  sodium for osteoarthritis and monitor kidney function quarterly.  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 BUN and creatinine levels are slightly increased but normal. Monitor kidney function quarterly.  Unspecified hearing loss under evaluation   Hearing loss is under evaluation. An ENT has been consulted, and an MRI is scheduled to rule out aneurysms and brain tumors. A CT scan is planned if the MRI is negative. Explore hearing aid options.

## 2023-11-17 NOTE — Assessment & Plan Note (Addendum)
 Reviewed BP log and average around 140/70s continue close monitoring no treatment needed.

## 2023-11-17 NOTE — Assessment & Plan Note (Signed)
 Diclofenac  sodium effectively resolves her hip pain. BUN and creatinine levels are increased but remain normal, raising renal concerns. Continue diclofenac  sodium for osteoarthritis and monitor kidney function quarterly new standing order placed.

## 2023-11-23 ENCOUNTER — Ambulatory Visit (HOSPITAL_COMMUNITY)
Admission: RE | Admit: 2023-11-23 | Discharge: 2023-11-23 | Disposition: A | Discharge status: Home / Self Care | Source: Ambulatory Visit | Attending: Otolaryngology | Admitting: Otolaryngology

## 2023-11-23 DIAGNOSIS — H93A3 Pulsatile tinnitus, bilateral: Secondary | ICD-10-CM | POA: Diagnosis present

## 2023-11-23 MED ORDER — GADOBUTROL 1 MMOL/ML IV SOLN
8.0000 mL | Freq: Once | INTRAVENOUS | Status: AC | PRN
  Administered 2023-11-23: 8 mL via INTRAVENOUS

## 2023-11-25 ENCOUNTER — Other Ambulatory Visit (INDEPENDENT_AMBULATORY_CARE_PROVIDER_SITE_OTHER): Payer: Self-pay | Admitting: Otolaryngology

## 2023-11-25 DIAGNOSIS — H93A3 Pulsatile tinnitus, bilateral: Secondary | ICD-10-CM

## 2023-11-26 ENCOUNTER — Telehealth (INDEPENDENT_AMBULATORY_CARE_PROVIDER_SITE_OTHER): Payer: Self-pay | Admitting: Otolaryngology

## 2023-11-26 NOTE — Telephone Encounter (Signed)
 Per Dr. Karis, I told patient that her Brain MRI was normal. The next step is  brain  CT angiogram. Patient understood.

## 2024-01-06 ENCOUNTER — Ambulatory Visit

## 2024-01-06 VITALS — Ht 66.0 in | Wt 178.0 lb

## 2024-01-06 DIAGNOSIS — Z0000 Encounter for general adult medical examination without abnormal findings: Secondary | ICD-10-CM | POA: Diagnosis not present

## 2024-01-06 NOTE — Progress Notes (Signed)
 Please attest and cosign this visit due to patients primary care provider not being in the office at the time the visit was completed.    Subjective:   Renee Jordan is a 68 y.o. who presents for a Medicare Wellness preventive visit.  As a reminder, Annual Wellness Visits don't include a physical exam, and some assessments may be limited, especially if this visit is performed virtually. We may recommend an in-person follow-up visit with your provider if needed.  Visit Complete: Virtual I connected with  Renee Jordan on 01/06/24 by a audio enabled telemedicine application and verified that I am speaking with the correct person using two identifiers.  Patient Location: Home  Provider Location: Office/Clinic  I discussed the limitations of evaluation and management by telemedicine. The patient expressed understanding and agreed to proceed.  Vital Signs: Because this visit was a virtual/telehealth visit, some criteria may be missing or patient reported. Any vitals not documented were not able to be obtained and vitals that have been documented are patient reported.  VideoDeclined- This patient declined Librarian, academic. Therefore the visit was completed with audio only.  Persons Participating in Visit: Patient.  AWV Questionnaire: Yes: Patient Medicare AWV questionnaire was completed by the patient on 01/02/24; I have confirmed that all information answered by patient is correct and no changes since this date.  Cardiac Risk Factors include: advanced age (>47men, >47 women);dyslipidemia     Objective:    Today's Vitals   01/06/24 1135  Weight: 178 lb (80.7 kg)  Height: 5' 6 (1.676 m)   Body mass index is 28.73 kg/m.     01/06/2024   11:45 AM 01/02/2022    9:27 AM  Advanced Directives  Does Patient Have a Medical Advance Directive? Yes Yes  Type of Estate agent of [place];Living will Healthcare Power of  [place];Living will  Does patient want to make changes to medical advance directive?  No - Patient declined  Copy of Healthcare Power of Attorney in Chart? Yes - validated most recent copy scanned in chart (See row information) No - copy requested    Current Medications (verified) Outpatient Encounter Medications as of 01/06/2024  Medication Sig   acetaminophen (TYLENOL) 500 MG tablet Take 500 mg by mouth every 6 (six) hours as needed.   cyclobenzaprine  (FLEXERIL ) 5 MG tablet Take 1 tablet (5 mg total) by mouth 3 (three) times daily as needed for muscle spasms.   diclofenac  (VOLTAREN ) 75 MG EC tablet Take 1 tablet (75 mg total) by mouth 2 (two) times daily as needed.   levothyroxine (SYNTHROID, LEVOTHROID) 50 MCG tablet Take 50 mcg by mouth daily before breakfast.   pravastatin  (PRAVACHOL ) 20 MG tablet Take 1 tablet (20 mg total) by mouth daily.   Vitamin D , Ergocalciferol , (DRISDOL ) 1.25 MG (50000 UNIT) CAPS capsule Take 1 capsule (50,000 Units total) by mouth every 7 (seven) days.   No facility-administered encounter medications on file as of 01/06/2024.    Allergies (verified) Codeine   History: Past Medical History:  Diagnosis Date   Allergy 03/31/2013   occasional, mild sneezing in autumn   Anemia    Arthritis    Heart murmur 09/29/1971   various doctors have indicated I have a slight heart murmur   History of chicken pox    History of measles, mumps, or rubella    HPV (human papilloma virus) infection    Hyperlipidemia Spring 2025   started taking statins   Hypertension spring  2025   fluctuates, borderline   Infertility, female    Joint problem    Shingles    Thyroid  disease    Uterine polyp    Yeast infection    Past Surgical History:  Procedure Laterality Date   BUNIONECTOMY     DILATION AND CURETTAGE OF UTERUS     KNEE SURGERY  2010   ACL repair, microfracture   LAPAROSCOPY     knee   polyp removal     WISDOM TOOTH EXTRACTION     Family History  Problem  Relation Age of Onset   Heart disease Brother    Diabetes Brother    Hyperlipidemia Brother    Hypertension Brother    Hearing loss Brother 18       CAD/PTCA   Alcohol abuse Brother    Arthritis Brother    Drug abuse Brother    Stroke Brother    Asthma Sister    Arthritis Sister    Hyperlipidemia Sister    Emphysema Mother    Arthritis Mother    Lung cancer Father 78       lung cancer/prostate cancer   Prostate cancer Father    Alcohol abuse Father    Cancer Father    Hypertension Brother    Alcohol abuse Brother    Stroke Paternal Grandfather    Stroke Maternal Grandmother    Heart disease Maternal Grandfather    Stroke Maternal Grandfather    Stroke Paternal Grandmother    Alcohol abuse Brother    Cancer Brother    Hearing loss Brother    Social History   Socioeconomic History   Marital status: Married    Spouse name: Garnette   Number of children: 1   Years of education: college   Highest education level: Bachelor's degree (e.g., BA, AB, BS)  Occupational History   Not on file  Tobacco Use   Smoking status: Never   Smokeless tobacco: Never  Vaping Use   Vaping status: Never Used  Substance and Sexual Activity   Alcohol use: No   Drug use: No   Sexual activity: Yes    Birth control/protection: Post-menopausal  Other Topics Concern   Not on file  Social History Narrative   Marital status: married x 24 years      Children: 1 daughter 70; 2 grandchildren (23,11)      Lives: with husband      EMployment: [employer]; full time; happy     Tobacco: never      Alcohol:  None      Exercise: a lot; [employer]; hikes every weekend. 2-3 times per week; hikes a lot; rides bike. Swims weekly.   Social Drivers of Corporate investment banker Strain: Low Risk  (01/06/2024)   Overall Financial Resource Strain (CARDIA)    Difficulty of Paying Living Expenses: Not hard at all  Food Insecurity: No Food Insecurity (01/06/2024)   Hunger Vital Sign     Worried About Running Out of Food in the Last Year: Never true    Ran Out of Food in the Last Year: Never true  Transportation Needs: No Transportation Needs (01/06/2024)   PRAPARE - Administrator, Civil Service (Medical): No    Lack of Transportation (Non-Medical): No  Physical Activity: Sufficiently Active (01/06/2024)   Exercise Vital Sign    Days of Exercise per Week: 5 days    Minutes of Exercise per Session: 40 min  Stress: Stress Concern  Present (01/06/2024)   [employer] of Occupational Health - Occupational Stress Questionnaire    Feeling of Stress: Rather much  Social Connections: Moderately Integrated (01/06/2024)   Social Connection and Isolation Panel    Frequency of Communication with Friends and Family: Twice a week    Frequency of Social Gatherings with Friends and Family: Three times a week    Attends Religious Services: Never    Active Member of Clubs or Organizations: Yes    Attends Engineer, structural: More than 4 times per year    Marital Status: Married    Tobacco Counseling Counseling given: Not Answered    Clinical Intake:  Pre-visit preparation completed: Yes  Pain : No/denies pain     BMI - recorded: 28.73 Nutritional Status: BMI 25 -29 Overweight Nutritional Risks: None Diabetes: No  Lab Results  Component Value Date   HGBA1C 5.8 07/28/2023   HGBA1C 5.6 01/30/2017     How often do you need to have someone help you when you read instructions, pamphlets, or other written materials from your doctor or pharmacy?: 1 - Never  Interpreter Needed?: No  Comments: lives with husband Information entered by :: B.Elvina Bosch,LPN   Activities of Daily Living     01/02/2024    8:48 AM  In your present state of health, do you have any difficulty performing the following activities:  Hearing? 0  Vision? 0  Difficulty concentrating or making decisions? 0  Walking or climbing stairs? 0  Dressing or bathing? 0  Doing  errands, shopping? 0  Preparing Food and eating ? N  Using the Toilet? N  In the past six months, have you accidently leaked urine? N  Do you have problems with loss of bowel control? N  Managing your Medications? N  Managing your Finances? N  Housekeeping or managing your Housekeeping? N    Patient Care Team: Rollene Almarie LABOR, MD as PCP - General (Internal Medicine) Verta Blossom, CNM as Midwife (Obstetrics and Gynecology) Tommas Pears, MD as Consulting Physician (Endocrinology) Madelyn Deanne BRAVO, OD (Optometry)  I have updated your Care Teams any recent Medical Services you may have received from other providers in the past year.     Assessment:   This is a routine wellness examination for Deena.  Hearing/Vision screen Hearing Screening - Comments:: Patient has hearing difficulties;had hearing test and gonna get hearing aids Vision Screening - Comments:: Pt says their vision is good with glasses Dr  Deanne Roth-UTD w/visits   Goals Addressed             This Visit's Progress    Patient Stated   On track    01/06/24-Maintain my independence; continue to be physically active.     Patient Stated       I would like to eat better and manage blood pressure (stay off medications)       Depression Screen     01/06/2024   11:42 AM 11/17/2023    9:11 AM 09/12/2020    8:50 AM 11/22/2019    3:58 PM 09/09/2019   11:41 AM 09/02/2019    9:42 AM 08/24/2019   10:12 AM  PHQ 2/9 Scores  PHQ - 2 Score 0 0 0 0 0 0 0  PHQ- 9 Score  2         Fall Risk     01/06/2024   11:39 AM 01/02/2024    8:48 AM 11/17/2023    9:10 AM 07/28/2023    8:53 AM  07/28/2023    8:47 AM  Fall Risk   Falls in the past year? 0 0 0 0 0  Number falls in past yr: 0  0 0 0  Injury with Fall? 0  0 0 0  Risk for fall due to : No Fall Risks      Follow up Education provided;Falls prevention discussed  Falls evaluation completed Falls evaluation completed Falls evaluation completed    MEDICARE RISK AT  HOME:  Medicare Risk at Home Any stairs in or around the home?: (Patient-Rptd) Yes If so, are there any without handrails?: (Patient-Rptd) No Home free of loose throw rugs in walkways, pet beds, electrical cords, etc?: (Patient-Rptd) No Adequate lighting in your home to reduce risk of falls?: (Patient-Rptd) Yes Life alert?: (Patient-Rptd) No Use of a cane, walker or w/c?: (Patient-Rptd) No Grab bars in the bathroom?: (Patient-Rptd) Yes Shower chair or bench in shower?: (Patient-Rptd) No Elevated toilet seat or a handicapped toilet?: (Patient-Rptd) No  TIMED UP AND GO:  Was the test performed?  No  Cognitive Function: 6CIT completed        01/06/2024   11:46 AM 01/02/2022    9:28 AM  6CIT Screen  What Year? 0 points 0 points  What month? 0 points 0 points  What time? 0 points 0 points  Count back from 20 0 points 0 points  Months in reverse 0 points 0 points  Repeat phrase 0 points 0 points  Total Score 0 points 0 points    Immunizations Immunization History  Administered Date(s) Administered   Fluad Quad(high Dose 65+) 01/13/2022   Influenza,inj,Quad PF,6+ Mos 01/30/2017, 11/27/2017, 11/22/2019   Influenza-Unspecified 01/13/2021   PFIZER(Purple Top)SARS-COV-2 Vaccination 05/09/2019, 06/03/2019   Pfizer Covid-19 Vaccine Bivalent Booster 61yrs & up 01/13/2022   Td 09/04/2005   Tdap 01/30/2017, 08/24/2019    Screening Tests Health Maintenance  Topic Date Due   Pneumococcal Vaccine: 50+ Years (1 of 1 - PCV) Never done   Zoster Vaccines- Shingrix (1 of 2) 02/25/2004   Influenza Vaccine  10/30/2023   COVID-19 Vaccine (5 - 2025-26 season) 11/30/2023   Medicare Annual Wellness (AWV)  01/05/2025   Mammogram  10/06/2025   Fecal DNA (Cologuard)  11/04/2026   DTaP/Tdap/Td (4 - Td or Tdap) 08/23/2029   DEXA SCAN  Completed   Hepatitis C Screening  Completed   Meningococcal B Vaccine  Aged Out    Health Maintenance Items Addressed: None due at this time. Pt will receive  vaccines at their pharmcy (Influenza & Covid) in November (has trip coming up)  Additional Screening:  Vision Screening: Recommended annual ophthalmology exams for early detection of glaucoma and other disorders of the eye. Is the patient up to date with their annual eye exam?  Yes  Who is the provider or what is the name of the office in which the patient attends annual eye exams? Dr Madelyn  Dental Screening: Recommended annual dental exams for proper oral hygiene  Community Resource Referral / Chronic Care Management: CRR required this visit?  No   CCM required this visit?  No   Plan:    I have personally reviewed and noted the following in the patient's chart:   Medical and social history Use of alcohol, tobacco or illicit drugs  Current medications and supplements including opioid prescriptions. Patient is not currently taking opioid prescriptions. Functional ability and status Nutritional status Physical activity Advanced directives List of other physicians Hospitalizations, surgeries, and ER visits in previous 12 months Vitals  Screenings to include cognitive, depression, and falls Referrals and appointments  In addition, I have reviewed and discussed with patient certain preventive protocols, quality metrics, and best practice recommendations. A written personalized care plan for preventive services as well as general preventive health recommendations were provided to patient.   Erminio LITTIE Saris, LPN   [id number]   After Visit Summary: (MyChart) Due to this being a telephonic visit, the after visit summary with patients personalized plan was offered to patient via MyChart   Notes: Nothing significant to report at this time.

## 2024-01-06 NOTE — Patient Instructions (Signed)
 Ms. Renee Jordan,  Thank you for taking the time for your Medicare Wellness Visit. I appreciate your continued commitment to your health goals. Please review the care plan we discussed, and feel free to reach out if I can assist you further.  Medicare recommends these wellness visits once per year to help you and your care team stay ahead of potential health issues. These visits are designed to focus on prevention, allowing your provider to concentrate on managing your acute and chronic conditions during your regular appointments.  Please note that Annual Wellness Visits do not include a physical exam. Some assessments may be limited, especially if the visit was conducted virtually. If needed, we may recommend a separate in-person follow-up with your provider.  Ongoing Care Seeing your primary care provider every 3 to 6 months helps us  monitor your health and provide consistent, personalized care.   Referrals If a referral was made during today's visit and you haven't received any updates within two weeks, please contact the referred provider directly to check on the status.  Recommended Screenings:  Health Maintenance  Topic Date Due   Pneumococcal Vaccine for age over 80 (1 of 1 - PCV) Never done   Zoster (Shingles) Vaccine (1 of 2) 02/25/2004   Flu Shot  10/30/2023   COVID-19 Vaccine (5 - 2025-26 season) 11/30/2023   Medicare Annual Wellness Visit  01/05/2025   Breast Cancer Screening  10/06/2025   Cologuard (Stool DNA test)  11/04/2026   DTaP/Tdap/Td vaccine (4 - Td or Tdap) 08/23/2029   DEXA scan (bone density measurement)  Completed   Hepatitis C Screening  Completed   Meningitis B Vaccine  Aged Out       01/02/2022    9:27 AM  Advanced Directives  Does Patient Have a Medical Advance Directive? Yes  Type of Estate agent of [place];Living will  Does patient want to make changes to medical advance directive? No - Patient declined  Copy of Healthcare Power  of Attorney in Chart? No - copy requested   Advance Care Planning is important because it: Ensures you receive medical care that aligns with your values, goals, and preferences. Provides guidance to your family and loved ones, reducing the emotional burden of decision-making during critical moments.  Vision: Annual vision screenings are recommended for early detection of glaucoma, cataracts, and diabetic retinopathy. These exams can also reveal signs of chronic conditions such as diabetes and high blood pressure.  Dental: Annual dental screenings help detect early signs of oral cancer, gum disease, and other conditions linked to overall health, including heart disease and diabetes.  Please see the attached documents for additional preventive care recommendations.

## 2024-01-21 ENCOUNTER — Ambulatory Visit: Payer: Self-pay

## 2024-01-21 ENCOUNTER — Ambulatory Visit (INDEPENDENT_AMBULATORY_CARE_PROVIDER_SITE_OTHER): Admitting: Family Medicine

## 2024-01-21 VITALS — BP 132/80 | HR 75 | Temp 97.4°F | Ht 66.0 in | Wt 183.2 lb

## 2024-01-21 DIAGNOSIS — W57XXXA Bitten or stung by nonvenomous insect and other nonvenomous arthropods, initial encounter: Secondary | ICD-10-CM

## 2024-01-21 DIAGNOSIS — S30860A Insect bite (nonvenomous) of lower back and pelvis, initial encounter: Secondary | ICD-10-CM | POA: Diagnosis not present

## 2024-01-21 NOTE — Telephone Encounter (Signed)
 FYI Only or Action Required?: FYI only for provider.  Patient was last seen in primary care on 11/17/2023 by Rollene Almarie LABOR, MD.  Called Nurse Triage reporting Insect Bite.  Symptoms began several days ago.  Interventions attempted: Rest, hydration, or home remedies.  Symptoms are: gradually worsening.  Triage Disposition: See Physician Within 24 Hours  Patient/caregiver understands and will follow disposition?: Yes Reason for Disposition  [1] Red or very tender (to touch) area AND [2] getting larger over 48 hours after the bite  Answer Assessment - Initial Assessment Questions Denies fever or discharge.  1. TYPE of SPIDER: What type of spider was it?  (e.g., name, unknown, or brief description)     Unknown  2. LOCATION: Where is the bite located?      Left buttox, towards leg crease  3. PAIN: Is there any pain? If Yes, ask: How bad is it?  (Scale 1-10; or mild, moderate, severe)     Painful only with touch  4. SWELLING: How big is the swelling? (Inches, cm or compare to coins)      Feels bumpy  5. ONSET: When did the bite occur? (e.g., minutes, hours ago)      Sunday evening  6. TETANUS: When was your last tetanus booster?      May 2021  7. OTHER SYMPTOMS: Do you have any other symptoms?  (e.g., muscle cramps, abdomen pain, change in urine color)     Denies  Protocols used: Spider Bite - [facility] Copied from CRM [id number]. Topic: Clinical - Red Word Triage >> Jan 21, 2024  8:23 AM Zy'onna H wrote: Red Word that prompted transfer to Nurse Triage: Patient has a spider bite, on her bottom.  Has had the bite - Sunday Evening/ Monday Afternoon  Bite is getting worse, possible will need antibiotics  **Transf. to NT**

## 2024-01-21 NOTE — Progress Notes (Signed)
 Established Patient Office Visit   Subjective  Patient ID: Renee Jordan, female    DOB: 08-27-1955  Age: 68 y.o. MRN: [id number]  Chief Complaint  Patient presents with   Acute Visit    Patient came in today for left upper thigh bug bite, started 4 days ago, redness swelling pain and itchy ate 5 out of 10     Pt is a 68 yo female followed by Almarie Cleveland, MD and seen for acute concern.  Patient noticed pain bump on left buttock while camping.  Unsure if she got bit by an insect.  Notes edema, pruritus and skin feeling like it does not have normal sensation in the area of possible bite.  Denies drainage, HA, fever, chills, n/v, pain that moves.      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   Sensorineural hearing loss, bilateral 11/14/2023   Mixed hyperlipidemia 10/30/2023   Vitamin D  deficiency 10/30/2023   Bilateral hearing loss 07/28/2023   Numbness of left foot 07/28/2023   Elevated blood pressure reading 07/28/2023   Abnormal cervical Papanicolaou smear 02/13/2017   Degenerative disc disease, cervical 01/26/2014   Osteoarthritis 01/26/2014   Goiter 01/16/2012   Past Medical History:  Diagnosis Date   Allergy 03/31/2013   occasional, mild sneezing in autumn   Anemia    Arthritis    Heart murmur 09/29/1971   various doctors have indicated I have a slight heart murmur   History of chicken pox    History of measles, mumps, or rubella    HPV (human papilloma virus) infection    Hyperlipidemia Spring 2025   started taking statins   Hypertension spring 2025   fluctuates, borderline   Infertility, female    Joint problem    Shingles    Thyroid  disease    Uterine polyp    Yeast infection    Past Surgical History:  Procedure Laterality Date   BUNIONECTOMY     DILATION AND CURETTAGE OF UTERUS     KNEE SURGERY  2010   ACL repair, microfracture   LAPAROSCOPY     knee   polyp removal     WISDOM TOOTH  EXTRACTION     Social History   Tobacco Use   Smoking status: Never   Smokeless tobacco: Never  Vaping Use   Vaping status: Never Used  Substance Use Topics   Alcohol use: No   Drug use: No   Family History  Problem Relation Age of Onset   Heart disease Brother    Diabetes Brother    Hyperlipidemia Brother    Hypertension Brother    Hearing loss Brother 89       CAD/PTCA   Alcohol abuse Brother    Arthritis Brother    Drug abuse Brother    Stroke Brother    Asthma Sister    Arthritis Sister    Hyperlipidemia Sister    Emphysema Mother    Arthritis Mother    Lung cancer Father 34       lung cancer/prostate cancer   Prostate cancer Father    Alcohol abuse Father    Cancer Father    Hypertension Brother    Alcohol abuse Brother    Stroke Paternal Grandfather    Stroke Maternal Grandmother    Heart disease Maternal Grandfather    Stroke Maternal Grandfather    Stroke Paternal Grandmother    Alcohol abuse Brother  Cancer Brother    Hearing loss Brother    Allergies  Allergen Reactions   Codeine Nausea And Vomiting    ROS Negative unless stated above    Objective:     BP 132/80 (BP Location: Left Arm, Patient Position: Sitting, Cuff Size: Large)   Pulse 75   Temp (!) 97.4 F (36.3 C) (Oral)   Ht 5' 6 (1.676 m)   Wt 183 lb 3.2 oz (83.1 kg)   SpO2 98%   BMI 29.57 kg/m  BP Readings from Last 3 Encounters:  01/21/24 132/80  11/17/23 138/80  11/12/23 (!) 150/88   Wt Readings from Last 3 Encounters:  01/21/24 183 lb 3.2 oz (83.1 kg)  01/06/24 178 lb (80.7 kg)  11/17/23 181 lb (82.1 kg)      Physical Exam Constitutional:      General: She is not in acute distress.    Appearance: Normal appearance.  HENT:     Head: Normocephalic and atraumatic.     Nose: Nose normal.     Mouth/Throat:     Mouth: Mucous membranes are moist.  Cardiovascular:     Rate and Rhythm: Normal rate and regular rhythm.     Heart sounds: Normal heart sounds. No  murmur heard.    No gallop.  Pulmonary:     Effort: Pulmonary effort is normal. No respiratory distress.     Breath sounds: Normal breath sounds. No wheezing, rhonchi or rales.  Skin:    General: Skin is warm and dry.         Comments: A 3 mm flat punctum like area on inferior L buttock.  Edge of panty line crosses over area causing indention in skin.  No increased warmth, erythema, drainage or induration noted.  A 6 mm area of ecchymosis on R posterior lateral distal forearm.  Neurological:     Mental Status: She is alert and oriented to person, place, and time.        01/06/2024   11:42 AM 11/17/2023    9:11 AM 09/12/2020    8:50 AM  Depression screen PHQ 2/9  Decreased Interest 0 0 0  Down, Depressed, Hopeless 0 0 0  PHQ - 2 Score 0 0 0  Altered sleeping  2   Tired, decreased energy  0   Change in appetite  0   Feeling bad or failure about yourself   0   Trouble concentrating  0   Moving slowly or fidgety/restless  0   Suicidal thoughts  0   PHQ-9 Score  2   Difficult doing work/chores Not difficult at all Not difficult at all       09/02/2019    9:41 AM  GAD 7 : Generalized Anxiety Score  Nervous, Anxious, on Edge 0  Control/stop worrying 0  Worry too much - different things 0  Trouble relaxing 0  Restless 0  Easily annoyed or irritable 0  Afraid - awful might happen 0  Total GAD 7 Score 0  Anxiety Difficulty Not difficult at all     No results found for any visits on 01/21/24.    Assessment & Plan:   Insect bite of buttock with local reaction, initial encounter  Possible insect bite of left buttock without infection.  Discussed supportive care for pruritus/discomfort including Tylenol, NSAIDs, cortisone cream, antibacterial soap, p.o. antihistamine.  Advised underwear may be contributing to increased discomfort as rubbing against skin.  Discussed s/s of infection.  Given strict precautions.  Return if symptoms  worsen or fail to improve.   Clotilda JONELLE Single,  MD

## 2024-01-21 NOTE — Telephone Encounter (Signed)
 Please advise as Md is out of office

## 2024-01-24 ENCOUNTER — Other Ambulatory Visit: Payer: Self-pay | Admitting: Internal Medicine

## 2024-02-01 ENCOUNTER — Encounter: Payer: Self-pay | Admitting: Internal Medicine

## 2024-02-11 ENCOUNTER — Other Ambulatory Visit (HOSPITAL_COMMUNITY): Payer: Self-pay

## 2024-02-11 ENCOUNTER — Other Ambulatory Visit (INDEPENDENT_AMBULATORY_CARE_PROVIDER_SITE_OTHER)

## 2024-02-11 DIAGNOSIS — E782 Mixed hyperlipidemia: Secondary | ICD-10-CM | POA: Diagnosis not present

## 2024-02-11 DIAGNOSIS — M150 Primary generalized (osteo)arthritis: Secondary | ICD-10-CM

## 2024-02-11 LAB — CBC
HCT: 41.8 % (ref 36.0–46.0)
Hemoglobin: 14 g/dL (ref 12.0–15.0)
MCHC: 33.5 g/dL (ref 30.0–36.0)
MCV: 92.4 fl (ref 78.0–100.0)
Platelets: 297 K/uL (ref 150.0–400.0)
RBC: 4.52 Mil/uL (ref 3.87–5.11)
RDW: 13.2 % (ref 11.5–15.5)
WBC: 4.8 K/uL (ref 4.0–10.5)

## 2024-02-11 LAB — COMPREHENSIVE METABOLIC PANEL WITH GFR
ALT: 15 U/L (ref 0–35)
AST: 17 U/L (ref 0–37)
Albumin: 4 g/dL (ref 3.5–5.2)
Alkaline Phosphatase: 57 U/L (ref 39–117)
BUN: 22 mg/dL (ref 6–23)
CO2: 28 meq/L (ref 19–32)
Calcium: 9.3 mg/dL (ref 8.4–10.5)
Chloride: 104 meq/L (ref 96–112)
Creatinine, Ser: 0.9 mg/dL (ref 0.40–1.20)
GFR: 65.02 mL/min (ref >60.00)
Glucose, Bld: 101 mg/dL — ABNORMAL HIGH (ref 70–99)
Potassium: 4 meq/L (ref 3.5–5.1)
Sodium: 140 meq/L (ref 135–145)
Total Bilirubin: 0.5 mg/dL (ref 0.2–1.2)
Total Protein: 6.5 g/dL (ref 6.0–8.3)

## 2024-02-11 LAB — LIPID PANEL
Cholesterol: 244 mg/dL — ABNORMAL HIGH (ref 0–200)
HDL: 65.3 mg/dL (ref >39.00)
LDL Cholesterol: 147 mg/dL — ABNORMAL HIGH (ref 0–99)
NonHDL: 179.05
Total CHOL/HDL Ratio: 4
Triglycerides: 162 mg/dL — ABNORMAL HIGH (ref 0.0–149.0)
VLDL: 32.4 mg/dL (ref 0.0–40.0)

## 2024-02-11 MED ORDER — FLUZONE HIGH-DOSE 0.5 ML IM SUSY
0.5000 mL | PREFILLED_SYRINGE | Freq: Once | INTRAMUSCULAR | 0 refills | Status: AC
  Filled 2024-02-11: qty 0.5, 1d supply, fill #0

## 2024-02-11 MED ORDER — COVID-19 MRNA VAC-TRIS(PFIZER) 30 MCG/0.3ML IM SUSY
PREFILLED_SYRINGE | INTRAMUSCULAR | 0 refills | Status: AC
  Filled 2024-02-11: qty 0.3, 1d supply, fill #0

## 2024-02-15 ENCOUNTER — Ambulatory Visit: Payer: Self-pay | Admitting: Internal Medicine

## 2024-02-15 ENCOUNTER — Encounter: Payer: Self-pay | Admitting: Internal Medicine

## 2024-02-17 NOTE — Telephone Encounter (Signed)
 Pt is aware.

## 2024-04-18 ENCOUNTER — Ambulatory Visit: Payer: Self-pay

## 2024-04-18 NOTE — Telephone Encounter (Signed)
 FYI Only or Action Required?: FYI only for provider: appointment scheduled on tomorrow.  Patient was last seen in primary care on 01/21/2024 by Mercer Clotilda SAUNDERS, MD.  Called Nurse Triage reporting Cough. Yellow green phlegm  Symptoms began a week ago.  Interventions attempted: Nothing.  Symptoms are: unchanged.  Triage Disposition: See Physician Within 24 Hours  Patient/caregiver understands and will follow disposition?: Yes                          Reason for Triage: increase in green colored mucus, sinus infection    Reason for Disposition  [1] Continuous (nonstop) coughing interferes with work or school AND [2] no improvement using cough treatment per Care Advice  Answer Assessment - Initial Assessment Questions 1. ONSET: When did the cough begin?      12/16 - 2025 started with a head cold 2. SEVERITY: How bad is the cough today?      Mioderate 3. SPUTUM: Describe the color of your sputum (e.g., none, dry cough; clear, white, yellow, green)     Green yellow 4. HEMOPTYSIS: Are you coughing up any blood? If Yes, ask: How much? (e.g., flecks, streaks, tablespoons, etc.)     no 5. DIFFICULTY BREATHING: Are you having difficulty breathing? If Yes, ask: How bad is it? (e.g., mild, moderate, severe)      no 6. FEVER: Do you have a fever? If Yes, ask: What is your temperature, how was it measured, and when did it start?     Does not think so 7. CARDIAC HISTORY: Do you have any history of heart disease? (e.g., heart attack, congestive heart failure)      Takes a stain 8. LUNG HISTORY: Do you have any history of lung disease?  (e.g., pulmonary embolus, asthma, emphysema)     no 9. PE RISK FACTORS: Do you have a history of blood clots? (or: recent major surgery, recent prolonged travel, bedridden)     Yes - air travel from [place] 10. OTHER SYMPTOMS: Do you have any other symptoms? (e.g., runny nose, wheezing, chest pain)        no  12. TRAVEL: Have you traveled out of the country in the last month? (e.g., travel history, exposures)       Yes - [place] 2 [place]  Protocols used: Cough - Acute Productive-A-AH

## 2024-04-19 ENCOUNTER — Ambulatory Visit: Admitting: Family Medicine

## 2024-04-19 ENCOUNTER — Encounter: Payer: Self-pay | Admitting: Family Medicine

## 2024-04-19 VITALS — BP 146/80 | HR 75 | Temp 98.5°F | Ht 66.0 in | Wt 184.6 lb

## 2024-04-19 DIAGNOSIS — J069 Acute upper respiratory infection, unspecified: Secondary | ICD-10-CM

## 2024-04-19 DIAGNOSIS — R051 Acute cough: Secondary | ICD-10-CM | POA: Diagnosis not present

## 2024-04-19 DIAGNOSIS — B9689 Other specified bacterial agents as the cause of diseases classified elsewhere: Secondary | ICD-10-CM | POA: Diagnosis not present

## 2024-04-19 MED ORDER — AZITHROMYCIN 250 MG PO TABS: ORAL_TABLET | ORAL | 0 refills | Status: AC

## 2024-04-19 MED ORDER — PROMETHAZINE-DM 6.25-15 MG/5ML PO SYRP: 5.0000 mL | ORAL_SOLUTION | Freq: Four times a day (QID) | ORAL | 0 refills | Status: AC | PRN

## 2024-04-19 NOTE — Progress Notes (Signed)
 "  Acute Office Visit  Subjective:     Patient ID: Renee Jordan, female    DOB: Aug 02, 1955, 69 y.o.   MRN: [id number]  Chief Complaint  Patient presents with   URI    Recent travel to [place]. Deep cough, sneezing, runny nose, ongoing since 12/17. Worsened around 12/22, lingering since then. Tried OTC [place] cough syrup, tylenol, cough drops. No fevers, chills, SOB, wheezing.    HPI  Discussed the use of AI scribe software for clinical note transcription with the patient, who gave verbal consent to proceed.  History of Present Illness Renee Jordan is a 69 year old female with reactive airway syndrome who presents with persistent upper respiratory symptoms.  Upper respiratory symptoms - Onset of cold symptoms around December 17th - Brief improvement, then worsening by December 23rd - Persistent nasal congestion for several weeks, fatigue, intermittent right ear pain - Greenish yellow sputum in the mornings - Occasional sinus pressure - No fever, chills, sweats, or body aches  Cough - Persistent, mostly dry cough - Worsening of cough when lying down, interfering with sleep - Attempts to suppress coughing to avoid reactive airway flare-ups  Reactive airway syndrome - History of reactive airway syndrome - Does not use inhaler due to prior side effects - Allergic to codeine - No use of other medications for current symptoms      ROS Per HPI      Objective:    BP (!) 146/80 (BP Location: Left Arm, Patient Position: Sitting)   Pulse 75   Temp 98.5 F (36.9 C) (Temporal)   Ht 5' 6 (1.676 m)   Wt 184 lb 9.6 oz (83.7 kg)   SpO2 98%   BMI 29.80 kg/m    Physical Exam Vitals and nursing note reviewed.  Constitutional:      General: She is not in acute distress.    Appearance: She is normal weight.     Comments: Appears fatigued  HENT:     Head: Normocephalic and atraumatic.     Right Ear: External ear normal. A middle ear effusion is present.      Left Ear: External ear normal. A middle ear effusion is present.     Nose: Congestion present.     Mouth/Throat:     Mouth: Mucous membranes are moist.     Pharynx: Oropharynx is clear.     Comments: Oropharyngeal cobblestoning  Hoarse voice Eyes:     Extraocular Movements: Extraocular movements intact.     Pupils: Pupils are equal, round, and reactive to light.  Cardiovascular:     Rate and Rhythm: Normal rate and regular rhythm.     Pulses: Normal pulses.     Heart sounds: Normal heart sounds.  Pulmonary:     Effort: Pulmonary effort is normal. No respiratory distress.     Breath sounds: Normal breath sounds. No wheezing, rhonchi or rales.  Musculoskeletal:        General: Normal range of motion.     Cervical back: Normal range of motion.     Right lower leg: No edema.     Left lower leg: No edema.  Lymphadenopathy:     Cervical: Cervical adenopathy present.  Neurological:     General: No focal deficit present.     Mental Status: She is alert and oriented to person, place, and time.  Psychiatric:        Mood and Affect: Mood normal.        Thought Content:  Thought content normal.     No results found for any visits on 04/19/24.      Assessment & Plan:   Assessment and Plan Assessment & Plan Bacterial upper respiratory infection Persistent symptoms with concerning sputum color. No fever or pneumonia signs. Reactive airway syndrome noted. - concern for bacterial infection given length of symptoms - Prescribed azithromycin : 2 tablets on day 1, then 1 tablet daily for 4 days. - Prescribed cough cough syrup for nighttime use. - Advised to take cough syrup when not driving and able to nap. - Instructed to monitor symptoms and report if not improving by Thursday afternoon or Friday morning.     No orders of the defined types were placed in this encounter.    Meds ordered this encounter  Medications   azithromycin  (ZITHROMAX ) 250 MG tablet    Sig: Take 2  tablets on day 1, then 1 tablet daily on days 2 through 5    Dispense:  6 tablet    Refill:  0   promethazine -dextromethorphan (PROMETHAZINE -DM) 6.25-15 MG/5ML syrup    Sig: Take 5 mLs by mouth 4 (four) times daily as needed for cough.    Dispense:  118 mL    Refill:  0    Return if symptoms worsen or fail to improve.  Corean LITTIE Ku, FNP  "

## 2024-04-19 NOTE — Patient Instructions (Signed)
 I have sent in azithromycin for you to take.  Take 2 tablets today, then 1 tablet daily for the next 4 days.  I have sent in hydrocodone cough syrup for you to take 5 mL once daily in the evening as needed for cough.  This medication may make you sleepy.  Do not drive or operate heavy machinery while taking this medication.  Follow-up with me for new or worsening symptoms.

## 2024-05-03 ENCOUNTER — Telehealth: Payer: Self-pay

## 2024-05-03 DIAGNOSIS — E782 Mixed hyperlipidemia: Secondary | ICD-10-CM

## 2024-05-03 NOTE — Telephone Encounter (Signed)
 Copied from CRM [id number]. Topic: Clinical - Request for Lab/Test Order >> May 03, 2024  2:32 PM Corin V wrote: Reason for CRM: Patient is requesting that Dr. Rollene order the quarterly labs that they had discussed previously. She stated there should be a lipid, BUN, and creatine lab repeated around 2/13.

## 2024-05-06 NOTE — Telephone Encounter (Signed)
 Ordered labs.

## 2024-05-06 NOTE — Addendum Note (Signed)
 Addended by: ROLLENE ALMARIE LABOR on: 05/06/2024 09:47 AM   Modules accepted: Orders

## 2024-05-16 ENCOUNTER — Other Ambulatory Visit

## 2025-01-06 ENCOUNTER — Ambulatory Visit
